# Patient Record
Sex: Female | Born: 1950 | Race: White | Hispanic: No
Health system: Southern US, Community
[De-identification: ages and names within clinical notes are randomized; demographics above are authoritative.]

## PROBLEM LIST (undated history)

## (undated) DIAGNOSIS — R0902 Hypoxemia: Secondary | ICD-10-CM

## (undated) DIAGNOSIS — J159 Unspecified bacterial pneumonia: Secondary | ICD-10-CM

## (undated) DIAGNOSIS — U071 COVID-19: Secondary | ICD-10-CM

## (undated) DIAGNOSIS — J9621 Acute and chronic respiratory failure with hypoxia: Secondary | ICD-10-CM

---

## 2020-07-19 ENCOUNTER — Inpatient Hospital Stay
Admit: 2020-07-19 | Discharge: 2020-08-11 | Disposition: A | Discharge status: Skilled Nursing Facility | Payer: Medicare Other | Source: Other Acute Inpatient Hospital | Attending: Internal Medicine | Admitting: Internal Medicine

## 2020-07-19 DIAGNOSIS — J9621 Acute and chronic respiratory failure with hypoxia: Secondary | ICD-10-CM | POA: Diagnosis present

## 2020-07-19 DIAGNOSIS — Z0189 Encounter for other specified special examinations: Secondary | ICD-10-CM

## 2020-07-19 DIAGNOSIS — R1310 Dysphagia, unspecified: Secondary | ICD-10-CM

## 2020-07-19 DIAGNOSIS — U071 COVID-19: Secondary | ICD-10-CM | POA: Diagnosis present

## 2020-07-19 DIAGNOSIS — R0902 Hypoxemia: Secondary | ICD-10-CM | POA: Diagnosis present

## 2020-07-19 DIAGNOSIS — J159 Unspecified bacterial pneumonia: Secondary | ICD-10-CM | POA: Diagnosis present

## 2020-07-19 DIAGNOSIS — J9690 Respiratory failure, unspecified, unspecified whether with hypoxia or hypercapnia: Secondary | ICD-10-CM

## 2020-07-19 HISTORY — DX: Unspecified bacterial pneumonia: J15.9

## 2020-07-19 HISTORY — DX: COVID-19: U07.1

## 2020-07-19 HISTORY — DX: Acute and chronic respiratory failure with hypoxia: J96.21

## 2020-07-19 HISTORY — DX: Hypoxemia: R09.02

## 2020-07-20 ENCOUNTER — Other Ambulatory Visit (HOSPITAL_COMMUNITY): Payer: Medicare Other

## 2020-07-20 LAB — CBC
HCT: 39.6 % (ref 36.0–46.0)
Hemoglobin: 12.6 g/dL (ref 12.0–15.0)
MCH: 27.5 pg (ref 26.0–34.0)
MCHC: 31.8 g/dL (ref 30.0–36.0)
MCV: 86.5 fL (ref 80.0–100.0)
Platelets: 196 10*3/uL (ref 150–400)
RBC: 4.58 MIL/uL (ref 3.87–5.11)
RDW: 14.4 % (ref 11.5–15.5)
WBC: 6.3 10*3/uL (ref 4.0–10.5)
nRBC: 0 % (ref 0.0–0.2)

## 2020-07-20 LAB — COMPREHENSIVE METABOLIC PANEL
ALT: 36 U/L (ref 0–44)
AST: 28 U/L (ref 15–41)
Albumin: 2.8 g/dL — ABNORMAL LOW (ref 3.5–5.0)
Alkaline Phosphatase: 28 U/L — ABNORMAL LOW (ref 38–126)
Anion gap: 7 (ref 5–15)
BUN: 39 mg/dL — ABNORMAL HIGH (ref 8–23)
CO2: 32 mmol/L (ref 22–32)
Calcium: 8.9 mg/dL (ref 8.9–10.3)
Chloride: 96 mmol/L — ABNORMAL LOW (ref 98–111)
Creatinine, Ser: 0.88 mg/dL (ref 0.44–1.00)
GFR, Estimated: >60 mL/min (ref >60)
Glucose, Bld: 98 mg/dL (ref 70–99)
Potassium: 4.3 mmol/L (ref 3.5–5.1)
Sodium: 135 mmol/L (ref 135–145)
Total Bilirubin: 0.8 mg/dL (ref 0.3–1.2)
Total Protein: 4.7 g/dL — ABNORMAL LOW (ref 6.5–8.1)

## 2020-07-24 LAB — CBC
HCT: 42.6 % (ref 36.0–46.0)
Hemoglobin: 13.4 g/dL (ref 12.0–15.0)
MCH: 27.3 pg (ref 26.0–34.0)
MCHC: 31.5 g/dL (ref 30.0–36.0)
MCV: 86.9 fL (ref 80.0–100.0)
Platelets: 171 10*3/uL (ref 150–400)
RBC: 4.9 MIL/uL (ref 3.87–5.11)
RDW: 15.1 % (ref 11.5–15.5)
WBC: 6.5 10*3/uL (ref 4.0–10.5)
nRBC: 0 % (ref 0.0–0.2)

## 2020-07-24 LAB — BASIC METABOLIC PANEL
Anion gap: 9 (ref 5–15)
BUN: 38 mg/dL — ABNORMAL HIGH (ref 8–23)
CO2: 29 mmol/L (ref 22–32)
Calcium: 9.8 mg/dL (ref 8.9–10.3)
Chloride: 100 mmol/L (ref 98–111)
Creatinine, Ser: 0.71 mg/dL (ref 0.44–1.00)
GFR, Estimated: >60 mL/min (ref >60)
Glucose, Bld: 119 mg/dL — ABNORMAL HIGH (ref 70–99)
Potassium: 4.4 mmol/L (ref 3.5–5.1)
Sodium: 138 mmol/L (ref 135–145)

## 2020-07-25 ENCOUNTER — Other Ambulatory Visit (HOSPITAL_COMMUNITY): Payer: Medicare Other

## 2020-07-27 ENCOUNTER — Other Ambulatory Visit (HOSPITAL_COMMUNITY): Payer: Medicare Other

## 2020-07-27 LAB — BASIC METABOLIC PANEL
Anion gap: 18 — ABNORMAL HIGH (ref 5–15)
BUN: 62 mg/dL — ABNORMAL HIGH (ref 8–23)
CO2: 25 mmol/L (ref 22–32)
Calcium: 10.2 mg/dL (ref 8.9–10.3)
Chloride: 91 mmol/L — ABNORMAL LOW (ref 98–111)
Creatinine, Ser: 1.01 mg/dL — ABNORMAL HIGH (ref 0.44–1.00)
GFR, Estimated: >60 mL/min (ref >60)
Glucose, Bld: 242 mg/dL — ABNORMAL HIGH (ref 70–99)
Potassium: 3.3 mmol/L — ABNORMAL LOW (ref 3.5–5.1)
Sodium: 134 mmol/L — ABNORMAL LOW (ref 135–145)

## 2020-07-27 LAB — CBC
HCT: 43.1 % (ref 36.0–46.0)
Hemoglobin: 14.2 g/dL (ref 12.0–15.0)
MCH: 27.8 pg (ref 26.0–34.0)
MCHC: 32.9 g/dL (ref 30.0–36.0)
MCV: 84.3 fL (ref 80.0–100.0)
Platelets: 142 10*3/uL — ABNORMAL LOW (ref 150–400)
RBC: 5.11 MIL/uL (ref 3.87–5.11)
RDW: 14.9 % (ref 11.5–15.5)
WBC: 12 10*3/uL — ABNORMAL HIGH (ref 4.0–10.5)
nRBC: 0 % (ref 0.0–0.2)

## 2020-07-27 LAB — BRAIN NATRIURETIC PEPTIDE: B Natriuretic Peptide: 126.3 pg/mL — ABNORMAL HIGH (ref 0.0–100.0)

## 2020-07-27 MED ORDER — IOHEXOL 300 MG/ML  SOLN
75.0000 mL | Freq: Once | INTRAMUSCULAR | Status: AC | PRN
  Administered 2020-07-27: 75 mL via INTRAVENOUS

## 2020-07-28 NOTE — Consult Note (Signed)
Infectious Disease Consultation   Amanda Pittman  OIZ:[id number]  DOB: 1951/08/18  DOA: 07/19/2020  Requesting physician: Daine Gravel  Reason for consultation: Antibiotic recommendations   History of Present Illness: Amanda Pittman is an 69 y.o. female who was admitted to [facility] on 07/19/2020.  She has a history of rectal cancer, hypertension, hyperlipidemia.  She was diagnosed with Covid in September 2021.  She was admitted on 07/04/2020 at the acute facility after progressively worsening shortness of breath with increased oxygen requirement.  She had CTA that did not show any evidence of PE.  She had Covid related infiltrates.  She was treated with remdesivir, Decadron, zinc, Tocilizumab.  She had CT of the chest done yesterday which per report showing widespread bilateral hazy, patchy pulmonary infiltrates. Patient started on empiric antibiotics for likely aspiration pneumonia.  She is currently on 6 L Oxymizer.  She says she is having some cough but unable to produce any sputum.  She is having shortness of breath, denies having any chest pain, nausea, vomiting, abdominal pain, diarrhea or dysuria at this time.   Review of Systems:  Review of system negative except as mentioned above in the HPI.   Past Medical History: Hypertension, hyperlipidemia, rectal cancer  Past Surgical History: History of colostomy  Allergies: Codeine  Social History: No history of alcohol, tobacco or recreational drug abuse  Family History: Noncontributory to the present illness  Physical Exam: Vitals: Temperature 97.1, pulse 97, respiratory rate 14, blood pressure 134/66 Constitutional: Alert and awake, oriented x3, not in any acute distress. Eyes: PERLA, EOMI, irises appear normal, anicteric sclera,  ENMT: external ears and nose appear normal, normal hearing, lips appears normal, moist oral mucosa  Neck: neck appears normal, no masses, normal ROM CVS: S1-S2   Respiratory: Rhonchi, no wheezing Abdomen: soft nontender, nondistended, ostomy, normal bowel sounds Musculoskeletal: No edema Neuro: Cranial nerves II-XII intact Psych: judgement and insight appear normal, stable mood and affect Skin: no rashes  Data reviewed:  I have personally reviewed following labs and imaging studies Labs:  CBC: Recent Labs  Lab 07/24/20 0713 07/27/20 1706  WBC 6.5 12.0*  HGB 13.4 14.2  HCT 42.6 43.1  MCV 86.9 84.3  PLT 171 142*    Basic Metabolic Panel: Recent Labs  Lab 07/24/20 0713 07/27/20 1706  NA 138 134*  K 4.4 3.3*  CL 100 91*  CO2 29 25  GLUCOSE 119* 242*  BUN 38* 62*  CREATININE 0.71 1.01*  CALCIUM 9.8 10.2   GFR CrCl cannot be calculated (Unknown ideal weight.). Liver Function Tests: No results for input(s): AST, ALT, ALKPHOS, BILITOT, PROT, ALBUMIN in the last 168 hours. No results for input(s): LIPASE, AMYLASE in the last 168 hours. No results for input(s): AMMONIA in the last 168 hours. Coagulation profile No results for input(s): INR, PROTIME in the last 168 hours.  Cardiac Enzymes: No results for input(s): CKTOTAL, CKMB, CKMBINDEX, TROPONINI in the last 168 hours. BNP: Invalid input(s): POCBNP CBG: No results for input(s): GLUCAP in the last 168 hours. D-Dimer No results for input(s): DDIMER in the last 72 hours. Hgb A1c No results for input(s): HGBA1C in the last 72 hours. Lipid Profile No results for input(s): CHOL, HDL, LDLCALC, TRIG, CHOLHDL, LDLDIRECT in the last 72 hours. Thyroid function studies No results for input(s): TSH, T4TOTAL, T3FREE, THYROIDAB in the last 72 hours.  Invalid input(s): FREET3 Anemia work up No results for input(s): VITAMINB12, FOLATE,  FERRITIN, TIBC, IRON, RETICCTPCT in the last 72 hours. Urinalysis No results found for: COLORURINE, APPEARANCEUR, LABSPEC, PHURINE, GLUCOSEU, HGBUR, BILIRUBINUR, KETONESUR, PROTEINUR, UROBILINOGEN, NITRITE, LEUKOCYTESUR   Microbiology No results  found for this or any previous visit (from the past 240 hour(s)).   Inpatient Medications:   Please see MAR   Radiological Exams on Admission: CT CHEST W CONTRAST  Result Date: 07/27/2020 CLINICAL DATA:  Respiratory failure.  Pneumonia. EXAM: CT CHEST WITH CONTRAST TECHNIQUE: Multidetector CT imaging of the chest was performed during intravenous contrast administration. CONTRAST:  59mL OMNIPAQUE IOHEXOL 300 MG/ML  SOLN COMPARISON:  Radiography 07/25/2020 and 07/20/2020. FINDINGS: Cardiovascular: Heart size is normal.  No pericardial fluid. Mediastinum/Nodes: Pneumomediastinum. This is small to moderate in degree. No mass or lymphadenopathy. Lungs/Pleura: Widespread bilateral hazy and patchy pulmonary infiltrates with a pattern often seen in coronavirus pneumonia. Other infectious etiologies are certainly possible. No lobar consolidation, collapse or effusion. Tiny amount of pleural air on both sides. Upper Abdomen: Previous cholecystectomy. No acute upper abdominal finding. Musculoskeletal: Ordinary thoracic degenerative changes. IMPRESSION: 1. Widespread bilateral hazy and patchy pulmonary infiltrates with a pattern often seen in coronavirus pneumonia. Other infectious etiologies are possible. No lobar consolidation, collapse or effusion. Moderate pneumomediastinum. Tiny amount of pleural air on both sides, the most minimal detectable. Electronically Signed   By: Paulina Fusi M.D.   On: 07/27/2020 14:19    Impression/Recommendations Acute hypoxemic respiratory failure COVID-19 infection Pneumonia Leukocytosis Hypertension/hyperlipidemia Protein calorie malnutrition History of rectal cancer status post surgery with colostomy Debility  Acute hypoxemic respiratory failure: Patient had COVID-19 infection.  She was already treated for this at the outside facility.  She is on steroids, nebulizers.  Continue supportive management per the primary team.  Pneumonia: Chest imaging showing findings  concerning for pneumonia.  Probable secondary bacterial pneumonia.  She is having cough with shortness of breath but unable to produce sputum therefore may not be able to send for sputum cultures.  She has been started on cefepime, Flagyl empirically.  Will discontinue cefepime and switch to ciprofloxacin.  Continue Flagyl.  We will plan to treat for duration of 5- 7 days pending improvement.  If not improving consider repeating chest imaging preferably chest CT and sending sputum cultures if feasible.  Leukocytosis: Could be likely secondary to the steroids versus secondary bacterial pneumonia.  Continue to monitor.  Antibiotics as mentioned above.  Hypertension/hyperlipidemia: Continue medications and management per the primary team.  Protein calorie malnutrition: Management per the primary team.  Debility: She has some generalized weakness from the debility. Continue therapy and management per primary team.  Thank you for this consultation.  Plan of care discussed with the patient, primary team and pharmacy.  Vonzella Nipple M.D. 07/28/2020, 3:24 PM

## 2020-07-29 ENCOUNTER — Encounter: Payer: Self-pay | Admitting: Internal Medicine

## 2020-07-29 DIAGNOSIS — U071 COVID-19: Secondary | ICD-10-CM | POA: Diagnosis present

## 2020-07-29 DIAGNOSIS — J9621 Acute and chronic respiratory failure with hypoxia: Secondary | ICD-10-CM | POA: Diagnosis not present

## 2020-07-29 DIAGNOSIS — R0902 Hypoxemia: Secondary | ICD-10-CM | POA: Diagnosis present

## 2020-07-29 DIAGNOSIS — J159 Unspecified bacterial pneumonia: Secondary | ICD-10-CM | POA: Diagnosis not present

## 2020-07-29 LAB — CBC
HCT: 45.7 % (ref 36.0–46.0)
Hemoglobin: 14.8 g/dL (ref 12.0–15.0)
MCH: 27.5 pg (ref 26.0–34.0)
MCHC: 32.4 g/dL (ref 30.0–36.0)
MCV: 84.9 fL (ref 80.0–100.0)
Platelets: 148 10*3/uL — ABNORMAL LOW (ref 150–400)
RBC: 5.38 MIL/uL — ABNORMAL HIGH (ref 3.87–5.11)
RDW: 15.9 % — ABNORMAL HIGH (ref 11.5–15.5)
WBC: 9.1 10*3/uL (ref 4.0–10.5)
nRBC: 0 % (ref 0.0–0.2)

## 2020-07-29 LAB — BASIC METABOLIC PANEL
Anion gap: 16 — ABNORMAL HIGH (ref 5–15)
BUN: 64 mg/dL — ABNORMAL HIGH (ref 8–23)
CO2: 28 mmol/L (ref 22–32)
Calcium: 10.2 mg/dL (ref 8.9–10.3)
Chloride: 94 mmol/L — ABNORMAL LOW (ref 98–111)
Creatinine, Ser: 0.83 mg/dL (ref 0.44–1.00)
GFR, Estimated: >60 mL/min (ref >60)
Glucose, Bld: 198 mg/dL — ABNORMAL HIGH (ref 70–99)
Potassium: 3.5 mmol/L (ref 3.5–5.1)
Sodium: 138 mmol/L (ref 135–145)

## 2020-07-29 LAB — MAGNESIUM: Magnesium: 2.7 mg/dL — ABNORMAL HIGH (ref 1.7–2.4)

## 2020-07-29 NOTE — Consult Note (Signed)
Pulmonary Critical Care Medicine [facility] [place]  PULMONARY SERVICE  Date of Service: 07/29/2020  PULMONARY CRITICAL CARE CONSULT   Amanda Pittman  XVQ:[id number]  DOB: [birth year]   DOA: 07/19/2020  Referring Physician: Carron Curie, MD  HPI: Amanda Pittman is a 69 y.o. female seen for follow up of Acute on Chronic Respiratory Failure.  Patient has multiple medical problems including hypertension hyperlipidemia rectal cancer who was diagnosed with COVID-19 in September.  She was admitted in early October because of worsening shortness of breath.  At that time patient had worsening infiltrates consistent with COVID-19 infection.  Patient received remdesivir Decadron as well as Tocilizumab.  She has been having issues with ongoing high oxygen requirements.  Patient is on high flow oxygen at this time.  CT scan has shown patchy areas of infiltrate consistent with pneumonia.  She has been on Oxymizer and the oxygen requirements have gone up to high flow nasal cannula.  Review of Systems:  ROS performed and is unremarkable other than noted above.  Medical Hx: Past Medical History:  Diagnosis Date  . Arthritis  . At risk for falls  . Cancer (CMS-HCC)  uterine/rectal  . Cataract  not bad enough for surgery  . Colon cancer (CMS-HCC)  . Colon polyp  . H/O seasonal allergies  . Hyperlipidemia  . Hypertension  . Memory changes 05/04/2020  . Obesity  . RLS (restless legs syndrome)   Surgical Hx: Past Surgical History:  Procedure Laterality Date  . APPENDECTOMY 1978  . CHOLECYSTECTOMY 1984  . COLECTOMY 2002  . COLOSTOMY 2002  . HYSTERECTOMY  . JOINT REPLACEMENT Bilateral  2018  . LAPAROSCOPIC ASSISTED RADICAL VAGINAL HYSTERECTOMY W/ NODE BIOPSY  . PR COLONOSCOPY STOMA DX INDUDING COLLJ SPEC SPX N/A 07/05/2019  Procedure: COLONOSCOPY-STOMA; DX W/WO SPECMN (SEPART PROC); Surgeon: Donnie Coffin, MD; Location: [facility]; Service: General  Surgery  . PR TOTAL KNEE ARTHROPLASTY Right 11/05/2016  Procedure: ARTHROPLASTY, KNEE, CONDYLE & PLATEAU; MEDIAL & LAT COMPARTMENT W/WO PATELLA RESURFACE (TOTAL KNEE ARTHROP); Surgeon: Carmela Hurt, MD; Location: [facility]; Service: Orthopedics  . PR TOTAL KNEE ARTHROPLASTY Left 08/12/2017  Procedure: ARTHROPLASTY, KNEE, CONDYLE & PLATEAU; MEDIAL & LAT COMPARTMENT W/WO PATELLA RESURFACE (TOTAL KNEE ARTHROP); Surgeon: Carmela Hurt, MD; Location: [facility]; Service: Orthopedics   Family history: Mother had dementia, father died of cancerProblem Relation Age of Onset  . Alzheimer's disease Mother  . Cancer Father  lung smoker  . Alzheimer's disease Sister  . Heart disease Brother  . COPD Brother       Medications: Reviewed on Rounds  Physical Exam:  Vitals: Temperature is 97.5 pulse 108 respiratory 23 blood pressure is 149/79 saturations are 90%  Ventilator Settings on high flow oxygen weaning to Oxymizer  . General: Comfortable at this time . Eyes: Grossly normal lids, irises & conjunctiva . ENT: grossly tongue is normal . Neck: no obvious mass . Cardiovascular: S1-S2 normal no gallop or rub . Respiratory: No rhonchi no rales are noted at this time . Abdomen: Soft and nontender . Skin: no rash seen on limited exam . Musculoskeletal: not rigid . Psychiatric:unable to assess . Neurologic: no seizure no involuntary movements         Labs on Admission:  Basic Metabolic Panel: Recent Labs  Lab 07/24/20 0713 07/27/20 1706 07/29/20 0754  NA 138 134* 138  K 4.4 3.3* 3.5  CL 100 91* 94*  CO2 29 25 28   GLUCOSE 119* 242* 198*  BUN 38* 62* 64*  CREATININE 0.71 1.01* 0.83  CALCIUM 9.8 10.2 10.2  MG  --   --  2.7*    No results for input(s): PHART, PCO2ART, PO2ART, HCO3, O2SAT in the last 168 hours.  Liver Function Tests: No results for input(s): AST, ALT, ALKPHOS, BILITOT, PROT, ALBUMIN in the last 168 hours. No results for input(s): LIPASE, AMYLASE in  the last 168 hours. No results for input(s): AMMONIA in the last 168 hours.  CBC: Recent Labs  Lab 07/24/20 0713 07/27/20 1706 07/29/20 0754  WBC 6.5 12.0* 9.1  HGB 13.4 14.2 14.8  HCT 42.6 43.1 45.7  MCV 86.9 84.3 84.9  PLT 171 142* 148*    Cardiac Enzymes: No results for input(s): CKTOTAL, CKMB, CKMBINDEX, TROPONINI in the last 168 hours.  BNP (last 3 results) Recent Labs    07/27/20 1706  BNP 126.3*    ProBNP (last 3 results) No results for input(s): PROBNP in the last 8760 hours.   Radiological Exams on Admission: CT CHEST W CONTRAST  Result Date: 07/27/2020 CLINICAL DATA:  Respiratory failure.  Pneumonia. EXAM: CT CHEST WITH CONTRAST TECHNIQUE: Multidetector CT imaging of the chest was performed during intravenous contrast administration. CONTRAST:  62mL OMNIPAQUE IOHEXOL 300 MG/ML  SOLN COMPARISON:  Radiography 07/25/2020 and 07/20/2020. FINDINGS: Cardiovascular: Heart size is normal.  No pericardial fluid. Mediastinum/Nodes: Pneumomediastinum. This is small to moderate in degree. No mass or lymphadenopathy. Lungs/Pleura: Widespread bilateral hazy and patchy pulmonary infiltrates with a pattern often seen in coronavirus pneumonia. Other infectious etiologies are certainly possible. No lobar consolidation, collapse or effusion. Tiny amount of pleural air on both sides. Upper Abdomen: Previous cholecystectomy. No acute upper abdominal finding. Musculoskeletal: Ordinary thoracic degenerative changes. IMPRESSION: 1. Widespread bilateral hazy and patchy pulmonary infiltrates with a pattern often seen in coronavirus pneumonia. Other infectious etiologies are possible. No lobar consolidation, collapse or effusion. Moderate pneumomediastinum. Tiny amount of pleural air on both sides, the most minimal detectable. Electronically Signed   By: Paulina Fusi M.D.   On: 07/27/2020 14:19   DG CHEST PORT 1 VIEW  Result Date: 07/25/2020 CLINICAL DATA:  Shortness of breath, respiratory  failure EXAM: PORTABLE CHEST 1 VIEW COMPARISON:  Portable exam 1236 hours compared to 07/20/2020 FINDINGS: Normal heart size, mediastinal contours, and pulmonary vascularity. Central peribronchial thickening with patchy BILATERAL pulmonary infiltrates in the mid to lower lungs most consistent with multifocal pneumonia. Infiltrates appear grossly unchanged since previous exam. No pleural effusion or pneumothorax. Osseous structures unremarkable. IMPRESSION: Bronchitic changes with patchy BILATERAL pulmonary infiltrates in the mid to lower lungs consistent with multifocal pneumonia, grossly unchanged. Electronically Signed   By: Ulyses Southward M.D.   On: 07/25/2020 13:03    Assessment/Plan Active Problems:   Acute on chronic respiratory failure with hypoxia (HCC)   COVID-19 virus infection   Bacterial lobar pneumonia   Severe hypoxemia   1. Acute on chronic respiratory failure with hypoxia we will titrate oxygen down as tolerated chest x-ray CT scan results reviewed with some pulmonary infiltrates scattered throughout with groundglass opacities.  Consistent with coronavirus pneumonitis. 2. COVID-19 virus infection in recovery infectious disease is seeing the patient continue with their recommendations.  There is concern for secondary bacterial pneumonia on top of the viral recovery. 3. Bacterial pneumonia patient's antibiotics will be adjusted.  Patient has been on cefepime and Flagyl they recommended continuation of Flagyl. 4. Severe hypoxemia has been on high flow oxygen we will continue to adjust oxygen down.  I have personally  seen and evaluated the patient, evaluated laboratory and imaging results, formulated the assessment and plan and placed orders. The Patient requires high complexity decision making with multiple systems involvement.  Case was discussed on Rounds with the Respiratory Therapy Director and the Respiratory staff Time Spent  Yevonne Pax, MD [facility] Pulmonary Critical  Care Medicine Sleep Medicine

## 2020-07-30 DIAGNOSIS — J159 Unspecified bacterial pneumonia: Secondary | ICD-10-CM | POA: Diagnosis not present

## 2020-07-30 DIAGNOSIS — U071 COVID-19: Secondary | ICD-10-CM | POA: Diagnosis not present

## 2020-07-30 DIAGNOSIS — J9621 Acute and chronic respiratory failure with hypoxia: Secondary | ICD-10-CM | POA: Diagnosis not present

## 2020-07-30 DIAGNOSIS — R0902 Hypoxemia: Secondary | ICD-10-CM | POA: Diagnosis not present

## 2020-07-30 NOTE — Progress Notes (Signed)
Pulmonary Critical Care Medicine [facility] [place]   PULMONARY CRITICAL CARE SERVICE  PROGRESS NOTE  Date of Service: 07/30/2020  Amanda Pittman  JKD:[id number]  DOB: [birth year]   DOA: 07/19/2020  Referring Physician: Carron Curie, MD  HPI: Amanda Pittman is a 69 y.o. female seen for follow up of Acute on Chronic Respiratory Failure. She remains on high flow nasal cannula has been on 85% FiO2 currently requiring 40 L of oxygen. She seems to do worse laying flat. Most of her disease is in the lower lung zones based on the CT scan. She also did volunteer that she has a history of snoring and she thinks she may very well have sleep apnea but has never been formally tested for it or treated for it.  Medications: Reviewed on Rounds  Physical Exam:  Vitals: Temperature is 97.0 pulse 97 respiratory 15 blood pressure is 108/59 saturations 90%  Ventilator Settings off the ventilator on heated high flow FiO2 85% with 40 L flow rate  . General: Comfortable at this time . Eyes: Grossly normal lids, irises & conjunctiva . ENT: grossly tongue is normal . Neck: no obvious mass . Cardiovascular: S1 S2 normal no gallop . Respiratory: Scattered rhonchi coarse breath sounds . Abdomen: soft . Skin: no rash seen on limited exam . Musculoskeletal: not rigid . Psychiatric:unable to assess . Neurologic: no seizure no involuntary movements         Lab Data:   Basic Metabolic Panel: Recent Labs  Lab 07/24/20 0713 07/27/20 1706 07/29/20 0754  NA 138 134* 138  K 4.4 3.3* 3.5  CL 100 91* 94*  CO2 29 25 28   GLUCOSE 119* 242* 198*  BUN 38* 62* 64*  CREATININE 0.71 1.01* 0.83  CALCIUM 9.8 10.2 10.2  MG  --   --  2.7*    ABG: No results for input(s): PHART, PCO2ART, PO2ART, HCO3, O2SAT in the last 168 hours.  Liver Function Tests: No results for input(s): AST, ALT, ALKPHOS, BILITOT, PROT, ALBUMIN in the last 168 hours. No results for input(s): LIPASE, AMYLASE in the  last 168 hours. No results for input(s): AMMONIA in the last 168 hours.  CBC: Recent Labs  Lab 07/24/20 0713 07/27/20 1706 07/29/20 0754  WBC 6.5 12.0* 9.1  HGB 13.4 14.2 14.8  HCT 42.6 43.1 45.7  MCV 86.9 84.3 84.9  PLT 171 142* 148*    Cardiac Enzymes: No results for input(s): CKTOTAL, CKMB, CKMBINDEX, TROPONINI in the last 168 hours.  BNP (last 3 results) Recent Labs    07/27/20 1706  BNP 126.3*    ProBNP (last 3 results) No results for input(s): PROBNP in the last 8760 hours.  Radiological Exams: No results found.  Assessment/Plan Active Problems:   Acute on chronic respiratory failure with hypoxia (HCC)   COVID-19 virus infection   Bacterial lobar pneumonia   Severe hypoxemia   1. Acute on chronic respiratory failure with hypoxia we will continue with high flow oxygen titrate as tolerated. I have discussed with her the possibility of using positive airway pressure in the form of BiPAP and she is receptive to that. She will I think especially need to use it at nighttime while asleep. 2. COVID-19 virus infection in recovery 3. Bacterial pneumonia has been treated 4. Severe hypoxia remains on high flow. We will try noninvasive BiPAP to see if this helps with her oxygenation.   I have personally seen and evaluated the patient, evaluated laboratory and imaging results, formulated the assessment  and plan and placed orders. The Patient requires high complexity decision making with multiple systems involvement.  Rounds were done with the Respiratory Therapy Director and Staff therapists and discussed with nursing staff also.  Allyne Gee, MD [facility] Pulmonary Critical Care Medicine Sleep Medicine

## 2020-07-31 ENCOUNTER — Other Ambulatory Visit (HOSPITAL_COMMUNITY): Payer: Medicare Other

## 2020-07-31 DIAGNOSIS — J159 Unspecified bacterial pneumonia: Secondary | ICD-10-CM | POA: Diagnosis not present

## 2020-07-31 DIAGNOSIS — U071 COVID-19: Secondary | ICD-10-CM | POA: Diagnosis not present

## 2020-07-31 DIAGNOSIS — J9621 Acute and chronic respiratory failure with hypoxia: Secondary | ICD-10-CM | POA: Diagnosis not present

## 2020-07-31 DIAGNOSIS — R0902 Hypoxemia: Secondary | ICD-10-CM | POA: Diagnosis not present

## 2020-07-31 NOTE — Progress Notes (Signed)
Pulmonary Critical Care Medicine [facility] [place]   PULMONARY CRITICAL CARE SERVICE  PROGRESS NOTE  Date of Service: 07/31/2020  Amanda Pittman  JJK:[id number]  DOB: 29-Jul-1951   DOA: 07/19/2020  Referring Physician: Carron Curie, MD  HPI: Amanda Pittman is a 69 y.o. female seen for follow up of Acute on Chronic Respiratory Failure.  She used the biPAP briefly overnight.  She states that she did seem to sleep better with it.  Medications: Reviewed on Rounds  Physical Exam:  Vitals: Temperature is 96.6 pulse 85 respiratory rate 16 blood pressure is 150/46 saturations 92%  Ventilator Settings on high flow oxygen 4040 L flow rate 80% FiO2 with trying to use BiPAP at nighttime  . General: Comfortable at this time . Eyes: Grossly normal lids, irises & conjunctiva . ENT: grossly tongue is normal . Neck: no obvious mass . Cardiovascular: S1 S2 normal no gallop . Respiratory: Scattered rhonchi expansion is equal . Abdomen: soft . Skin: no rash seen on limited exam . Musculoskeletal: not rigid . Psychiatric:unable to assess . Neurologic: no seizure no involuntary movements         Lab Data:   Basic Metabolic Panel: Recent Labs  Lab 07/27/20 1706 07/29/20 0754  NA 134* 138  K 3.3* 3.5  CL 91* 94*  CO2 25 28  GLUCOSE 242* 198*  BUN 62* 64*  CREATININE 1.01* 0.83  CALCIUM 10.2 10.2  MG  --  2.7*    ABG: No results for input(s): PHART, PCO2ART, PO2ART, HCO3, O2SAT in the last 168 hours.  Liver Function Tests: No results for input(s): AST, ALT, ALKPHOS, BILITOT, PROT, ALBUMIN in the last 168 hours. No results for input(s): LIPASE, AMYLASE in the last 168 hours. No results for input(s): AMMONIA in the last 168 hours.  CBC: Recent Labs  Lab 07/27/20 1706 07/29/20 0754  WBC 12.0* 9.1  HGB 14.2 14.8  HCT 43.1 45.7  MCV 84.3 84.9  PLT 142* 148*    Cardiac Enzymes: No results for input(s): CKTOTAL, CKMB, CKMBINDEX, TROPONINI in the last 168  hours.  BNP (last 3 results) Recent Labs    07/27/20 1706  BNP 126.3*    ProBNP (last 3 results) No results for input(s): PROBNP in the last 8760 hours.  Radiological Exams: No results found.  Assessment/Plan Active Problems:   Acute on chronic respiratory failure with hypoxia (HCC)   COVID-19 virus infection   Bacterial lobar pneumonia   Severe hypoxemia   1. Acute on chronic respiratory failure hypoxia we will continue with heated high flow during the daytime titrate oxygen down as tolerated. 2. COVID-19 virus infection in recovery we will continue with supportive care. 3. Bacterial lobar pneumonia treated we will continue to follow 4. Severe hypoxia doing a little bit better today will need to continue with BiPAP increased pressures   I have personally seen and evaluated the patient, evaluated laboratory and imaging results, formulated the assessment and plan and placed orders. The Patient requires high complexity decision making with multiple systems involvement.  Rounds were done with the Respiratory Therapy Director and Staff therapists and discussed with nursing staff also.  Yevonne Pax, MD [facility] Pulmonary Critical Care Medicine Sleep Medicine

## 2020-08-01 ENCOUNTER — Other Ambulatory Visit (HOSPITAL_COMMUNITY): Payer: Medicare Other

## 2020-08-01 ENCOUNTER — Other Ambulatory Visit (HOSPITAL_BASED_OUTPATIENT_CLINIC_OR_DEPARTMENT_OTHER): Payer: Medicare Other

## 2020-08-01 DIAGNOSIS — J9621 Acute and chronic respiratory failure with hypoxia: Secondary | ICD-10-CM

## 2020-08-01 DIAGNOSIS — U071 COVID-19: Secondary | ICD-10-CM | POA: Diagnosis not present

## 2020-08-01 DIAGNOSIS — R0902 Hypoxemia: Secondary | ICD-10-CM | POA: Diagnosis not present

## 2020-08-01 DIAGNOSIS — J159 Unspecified bacterial pneumonia: Secondary | ICD-10-CM | POA: Diagnosis not present

## 2020-08-01 LAB — CBC
HCT: 43.8 % (ref 36.0–46.0)
Hemoglobin: 13.9 g/dL (ref 12.0–15.0)
MCH: 27.5 pg (ref 26.0–34.0)
MCHC: 31.7 g/dL (ref 30.0–36.0)
MCV: 86.6 fL (ref 80.0–100.0)
Platelets: 183 10*3/uL (ref 150–400)
RBC: 5.06 MIL/uL (ref 3.87–5.11)
RDW: 16 % — ABNORMAL HIGH (ref 11.5–15.5)
WBC: 16.9 10*3/uL — ABNORMAL HIGH (ref 4.0–10.5)
nRBC: 0.1 % (ref 0.0–0.2)

## 2020-08-01 LAB — BASIC METABOLIC PANEL
Anion gap: 17 — ABNORMAL HIGH (ref 5–15)
BUN: 72 mg/dL — ABNORMAL HIGH (ref 8–23)
CO2: 27 mmol/L (ref 22–32)
Calcium: 10 mg/dL (ref 8.9–10.3)
Chloride: 96 mmol/L — ABNORMAL LOW (ref 98–111)
Creatinine, Ser: 0.87 mg/dL (ref 0.44–1.00)
GFR, Estimated: >60 mL/min (ref >60)
Glucose, Bld: 243 mg/dL — ABNORMAL HIGH (ref 70–99)
Potassium: 3.3 mmol/L — ABNORMAL LOW (ref 3.5–5.1)
Sodium: 140 mmol/L (ref 135–145)

## 2020-08-01 NOTE — Progress Notes (Signed)
  Echocardiogram 2D Echocardiogram has been performed.  Celene Skeen 08/01/2020, 4:42 PM

## 2020-08-01 NOTE — Progress Notes (Signed)
Pulmonary Critical Care Medicine [facility] [place]   PULMONARY CRITICAL CARE SERVICE  PROGRESS NOTE  Date of Service: 08/01/2020  Amanda Pittman  YIF:[id number]  DOB: 1950/12/03   DOA: 07/19/2020  Referring Physician: Carron Curie, MD  HPI: Amanda Pittman is a 69 y.o. female seen for follow up of Acute on Chronic Respiratory Failure.  She remains on high flow oxygen 40 L she was using the BiPAP a little bit more appears to do fine with it at nighttime.  Medications: Reviewed on Rounds  Physical Exam:  Vitals: Temperature 97.2 pulse 97 respiratory 18 blood pressure is 144/73 saturations 92%  Ventilator Settings on heated high flow 40 L FiO2 80%   General: Comfortable at this time  Eyes: Grossly normal lids, irises & conjunctiva  ENT: grossly tongue is normal  Neck: no obvious mass  Cardiovascular: S1 S2 normal no gallop  Respiratory: No rhonchi coarse breath sounds  Abdomen: soft  Skin: no rash seen on limited exam  Musculoskeletal: not rigid  Psychiatric:unable to assess  Neurologic: no seizure no involuntary movements         Lab Data:   Basic Metabolic Panel: Recent Labs  Lab 07/27/20 1706 07/29/20 0754 08/01/20 0404  NA 134* 138 140  K 3.3* 3.5 3.3*  CL 91* 94* 96*  CO2 25 28 27   GLUCOSE 242* 198* 243*  BUN 62* 64* 72*  CREATININE 1.01* 0.83 0.87  CALCIUM 10.2 10.2 10.0  MG  --  2.7*  --     ABG: No results for input(s): PHART, PCO2ART, PO2ART, HCO3, O2SAT in the last 168 hours.  Liver Function Tests: No results for input(s): AST, ALT, ALKPHOS, BILITOT, PROT, ALBUMIN in the last 168 hours. No results for input(s): LIPASE, AMYLASE in the last 168 hours. No results for input(s): AMMONIA in the last 168 hours.  CBC: Recent Labs  Lab 07/27/20 1706 07/29/20 0754 08/01/20 0404  WBC 12.0* 9.1 16.9*  HGB 14.2 14.8 13.9  HCT 43.1 45.7 43.8  MCV 84.3 84.9 86.6  PLT 142* 148* 183    Cardiac Enzymes: No results for  input(s): CKTOTAL, CKMB, CKMBINDEX, TROPONINI in the last 168 hours.  BNP (last 3 results) Recent Labs    07/27/20 1706  BNP 126.3*    ProBNP (last 3 results) No results for input(s): PROBNP in the last 8760 hours.  Radiological Exams: DG CHEST PORT 1 VIEW  Result Date: 08/01/2020 CLINICAL DATA:  Respiratory failure EXAM: PORTABLE CHEST 1 VIEW COMPARISON:  07/27/2020 FINDINGS: Cardiac shadow is stable. The previously seen pneumomediastinum is again identified but decreased. Patchy airspace opacities are again identified relatively stable from the prior exam. No sizable effusion is seen. No definitive pneumothorax is noted. The bony structures appear stable. IMPRESSION: Patchy airspace opacities similar to that seen on the prior exam consistent with the given clinical history. Electronically Signed   By: 07/29/2020 M.D.   On: 08/01/2020 15:33   ECHOCARDIOGRAM COMPLETE  Result Date: 08/01/2020    ECHOCARDIOGRAM REPORT   Patient Name:   Amanda Pittman Mitchell Date of Exam: 08/01/2020 Medical Rec #:  13/11/2019          Height:       65.0 in Accession #:    [id number]         Weight:       152.0 lb Date of Birth:  1951/05/14           BSA:  1.760 m Patient Age:    69 years           BP:           134/66 mmHg Patient Gender: F                  HR:           97 bpm. Exam Location:  Inpatient Procedure: 2D Echo Indications:    786.09 dyspnea  History:        Patient has no prior history of Echocardiogram examinations.                 Risk Factors:Hypertension and Dyslipidemia. Acute on chronic                 respiratory failure with hypoxia                 COVID-19 virus infection                 Bacterial lobar pneumonia                 worsening infiltrates due to covid infection.  Sonographer:    Celene Skeen RDCS (AE) Referring Phys: [id number] Tabbetha Kutscher A Yianna Tersigni  Sonographer Comments: No apical window, no parasternal window and Technically difficult study due to poor echo windows. IMPRESSIONS  1.  Extremely limited echo with poor windows.  2. Left ventricular ejection fraction could not be assessed. Left ventricular endocardial border not optimally defined to evaluate regional wall motion. Left ventricular diastolic parameters are indeterminate.  3. Right ventricular systolic function is hyperdynamic. The right ventricular size is normal.  4. The mitral valve was not assessed. No evidence of mitral valve regurgitation.  5. The aortic valve was not assessed. Aortic valve regurgitation is not visualized.  6. The inferior vena cava is normal in size with greater than 50% respiratory variability, suggesting right atrial pressure of 3 mmHg. Conclusion(s)/Recommendation(s): Very limited images and windows. Significant artifact. Consider alternate modality to evalute LV function if necessary. FINDINGS  Left Ventricle: Left ventricular ejection fraction could not adequately be assesed. Left ventricular endocardial border not optimally defined to evaluate regional wall motion. Left ventricular diastolic parameters are indeterminate. Right Ventricle: The right ventricular size is normal. No increase in right ventricular wall thickness. Right ventricular systolic function is hyperdynamic. Left Atrium: Left atrial size was not assessed. Right Atrium: Right atrial size was not assessed. Pericardium: The pericardium was not assessed. Mitral Valve: The mitral valve was not assessed. No evidence of mitral valve regurgitation. Tricuspid Valve: The tricuspid valve is not assessed. Tricuspid valve regurgitation is not demonstrated. Aortic Valve: The aortic valve was not assessed. Aortic valve regurgitation is not visualized. Pulmonic Valve: The pulmonic valve was not assessed. Pulmonic valve regurgitation is not visualized. Aorta: Aortic root could not be assessed. Venous: The inferior vena cava is normal in size with greater than 50% respiratory variability, suggesting right atrial pressure of 3 mmHg. IAS/Shunts: The  interatrial septum was not assessed. Zoila Shutter MD Electronically signed by Zoila Shutter MD Signature Date/Time: 08/01/2020/5:02:16 PM    Final     Assessment/Plan Active Problems:   Acute on chronic respiratory failure with hypoxia (HCC)   COVID-19 virus infection   Bacterial lobar pneumonia   Severe hypoxemia   1. Acute on chronic respiratory failure with hypoxia we will continue with trying to titrate oxygen down.  I have also gone ahead and ordered echocardiogram and follow-up x-ray.  The echocardiogram  revealed a preserved ejection fraction.  May need to get cardiology involved as I feel that there is still significant issues from hypoxia perspective which could be related to cardiology issues. 2. COVID-19 virus infection in recovery severe damage post Covid. 3. Bacterial lobar pneumonia treated with antibiotics we will continue to follow. 4. Severe hypoxia remains on high flow oxygen needs   I have personally seen and evaluated the patient, evaluated laboratory and imaging results, formulated the assessment and plan and placed orders. The Patient requires high complexity decision making with multiple systems involvement.  Rounds were done with the Respiratory Therapy Director and Staff therapists and discussed with nursing staff also.  Yevonne Pax, MD [facility] Pulmonary Critical Care Medicine Sleep Medicine

## 2020-08-03 ENCOUNTER — Other Ambulatory Visit (HOSPITAL_COMMUNITY): Payer: Medicare Other

## 2020-08-03 DIAGNOSIS — J9621 Acute and chronic respiratory failure with hypoxia: Secondary | ICD-10-CM | POA: Diagnosis not present

## 2020-08-03 DIAGNOSIS — U071 COVID-19: Secondary | ICD-10-CM | POA: Diagnosis not present

## 2020-08-03 DIAGNOSIS — R0902 Hypoxemia: Secondary | ICD-10-CM | POA: Diagnosis not present

## 2020-08-03 DIAGNOSIS — J159 Unspecified bacterial pneumonia: Secondary | ICD-10-CM | POA: Diagnosis not present

## 2020-08-03 LAB — CBC
HCT: 42.1 % (ref 36.0–46.0)
Hemoglobin: 13.2 g/dL (ref 12.0–15.0)
MCH: 27.6 pg (ref 26.0–34.0)
MCHC: 31.4 g/dL (ref 30.0–36.0)
MCV: 88.1 fL (ref 80.0–100.0)
Platelets: 218 10*3/uL (ref 150–400)
RBC: 4.78 MIL/uL (ref 3.87–5.11)
RDW: 16.6 % — ABNORMAL HIGH (ref 11.5–15.5)
WBC: 8.7 10*3/uL (ref 4.0–10.5)
nRBC: 0 % (ref 0.0–0.2)

## 2020-08-03 LAB — BASIC METABOLIC PANEL
Anion gap: 15 (ref 5–15)
BUN: 55 mg/dL — ABNORMAL HIGH (ref 8–23)
CO2: 26 mmol/L (ref 22–32)
Calcium: 9.6 mg/dL (ref 8.9–10.3)
Chloride: 99 mmol/L (ref 98–111)
Creatinine, Ser: 0.87 mg/dL (ref 0.44–1.00)
GFR, Estimated: >60 mL/min (ref >60)
Glucose, Bld: 356 mg/dL — ABNORMAL HIGH (ref 70–99)
Potassium: 3.2 mmol/L — ABNORMAL LOW (ref 3.5–5.1)
Sodium: 140 mmol/L (ref 135–145)

## 2020-08-03 NOTE — Consult Note (Signed)
Referring Physician: Dr. Lerry Liner Pittman is an 69 y.o. female.                       Chief Complaint: Chest pressure and shortness of breath.  HPI: 69 years old female with acute on chronic respiratory failure, hypertension, hyperlipidemia, rectal cancer had COVID -19 infection. Her shortness of breath continues with worsening pulmonary infiltrates. She is on high flow oxygen. She has some chest discomfort at times. She claims to have nuclear stress test 1-2 years ago and was told to have small blockage that an be treated medically. Her recent echocardiogram was non-diagnostic.   Past Medical History:  Diagnosis Date  . Acute on chronic respiratory failure with hypoxia (HCC)   . Bacterial lobar pneumonia   . COVID-19 virus infection   . Severe hypoxemia     Medical Hx: Past Medical History:  Diagnosis Date  . Arthritis  . At risk for falls  . Cancer (CMS-HCC)  uterine/rectal  . Cataract  not bad enough for surgery  . Colon cancer (CMS-HCC)  . Colon polyp  . H/O seasonal allergies  . Hyperlipidemia  . Hypertension  . Memory changes 05/04/2020  . Obesity  . RLS (restless legs syndrome)   Surgical Hx: Past Surgical History:  Procedure Laterality Date  . APPENDECTOMY 1978  . CHOLECYSTECTOMY 1984  . COLECTOMY 2002  . COLOSTOMY 2002  . HYSTERECTOMY  . JOINT REPLACEMENT Bilateral  2018  . LAPAROSCOPIC ASSISTED RADICAL VAGINAL HYSTERECTOMY W/ NODE BIOPSY  . PR COLONOSCOPY STOMA DX INDUDING COLLJ SPEC SPX N/A 07/05/2019  Procedure: COLONOSCOPY-STOMA; DX W/WO SPECMN (SEPART PROC); Surgeon: Donnie Coffin, MD; Location: [facility]; Service: General Surgery  . PR TOTAL KNEE ARTHROPLASTY Right 11/05/2016  Procedure: ARTHROPLASTY, KNEE, CONDYLE & PLATEAU; MEDIAL & LAT COMPARTMENT W/WO PATELLA RESURFACE (TOTAL KNEE ARTHROP); Surgeon: Carmela Hurt, MD; Location: [facility]; Service: Orthopedics  . PR TOTAL KNEE ARTHROPLASTY Left 08/12/2017  Procedure:  ARTHROPLASTY, KNEE, CONDYLE & PLATEAU; MEDIAL & LAT COMPARTMENT W/WO PATELLA RESURFACE (TOTAL KNEE ARTHROP); Surgeon: Carmela Hurt, MD; Location: [facility]; Service: Orthopedics   Family history: Mother had dementia, father died of cancerProblem Relation Age of Onset  . Alzheimer's disease Mother  . Cancer Father  lung smoker  . Alzheimer's disease Sister  . Heart disease Brother  . COPD Brother   The histories are not reviewed yet. Please review them in the "History" navigator section and refresh this SmartLink.  Social History:  has no history on file for tobacco use, alcohol use, and drug use.  Allergies: Not on File  No medications prior to admission.  As per chart  Results for orders placed or performed during the hospital encounter of 07/19/20 (from the past 48 hour(s))  Basic metabolic panel     Status: Abnormal   Collection Time: 08/03/20  4:16 AM  Result Value Ref Range   Sodium 140 135 - 145 mmol/L   Potassium 3.2 (L) 3.5 - 5.1 mmol/L   Chloride 99 98 - 111 mmol/L   CO2 26 22 - 32 mmol/L   Glucose, Bld 356 (H) 70 - 99 mg/dL    Comment: Glucose reference range applies only to samples taken after fasting for at least 8 hours.   BUN 55 (H) 8 - 23 mg/dL   Creatinine, Ser 2.75 0.44 - 1.00 mg/dL   Calcium 9.6 8.9 - 17.0 mg/dL   GFR, Estimated >01 >74 mL/min  Comment: (NOTE) Calculated using the CKD-EPI Creatinine Equation (2021)    Anion gap 15 5 - 15    Comment: Performed at [facility], [address] [place], [place] [address]  CBC     Status: Abnormal   Collection Time: 08/03/20  4:16 AM  Result Value Ref Range   WBC 8.7 4.0 - 10.5 K/uL   RBC 4.78 3.87 - 5.11 MIL/uL   Hemoglobin 13.2 12.0 - 15.0 g/dL   HCT 28.4 36 - 46 %   MCV 88.1 80.0 - 100.0 fL   MCH 27.6 26.0 - 34.0 pg   MCHC 31.4 30.0 - 36.0 g/dL   RDW 13.2 (H) 44.0 - 10.2 %   Platelets 218 150 - 400 K/uL   nRBC 0.0 0.0 - 0.2 %    Comment: Performed at [facility], [address], [place], [place] [address]   DG CHEST PORT 1 VIEW  Result Date: 08/01/2020 CLINICAL DATA:  Respiratory failure EXAM: PORTABLE CHEST 1 VIEW COMPARISON:  07/27/2020 FINDINGS: Cardiac shadow is stable. The previously seen pneumomediastinum is again identified but decreased. Patchy airspace opacities are again identified relatively stable from the prior exam. No sizable effusion is seen. No definitive pneumothorax is noted. The bony structures appear stable. IMPRESSION: Patchy airspace opacities similar to that seen on the prior exam consistent with the given clinical history. Electronically Signed   By: Alcide Clever M.D.   On: 08/01/2020 15:33   ECHOCARDIOGRAM COMPLETE  Result Date: 08/01/2020    ECHOCARDIOGRAM REPORT   Patient Name:   Amanda Pittman Date of Exam: 08/01/2020 Medical Rec #:  [id number]          Height:       65.0 in Accession #:    [id number]         Weight:       152.0 lb Date of Birth:  Nov 07, 1950           BSA:          1.760 m Patient Age:    69 years           BP:           134/66 mmHg Patient Gender: F                  HR:           97 bpm. Exam Location:  Inpatient Procedure: 2D Echo Indications:    786.09 dyspnea  History:        Patient has no prior history of Echocardiogram examinations.                 Risk Factors:Hypertension and Dyslipidemia. Acute on chronic                 respiratory failure with hypoxia                 COVID-19 virus infection                 Bacterial lobar pneumonia                 worsening infiltrates due to covid infection.  Sonographer:    Celene Skeen RDCS (AE) Referring Phys: [phone] SAADAT A KHAN  Sonographer Comments: No apical window, no parasternal window and Technically difficult study due to poor echo windows. IMPRESSIONS  1. Extremely limited echo with poor windows.  2. Left ventricular ejection fraction could not be assessed. Left  ventricular endocardial border not optimally defined to evaluate regional wall motion. Left ventricular  diastolic parameters are indeterminate.  3. Right ventricular systolic function is hyperdynamic. The right ventricular size is normal.  4. The mitral valve was not assessed. No evidence of mitral valve regurgitation.  5. The aortic valve was not assessed. Aortic valve regurgitation is not visualized.  6. The inferior vena cava is normal in size with greater than 50% respiratory variability, suggesting right atrial pressure of 3 mmHg. Conclusion(s)/Recommendation(s): Very limited images and windows. Significant artifact. Consider alternate modality to evalute LV function if necessary. FINDINGS  Left Ventricle: Left ventricular ejection fraction could not adequately be assesed. Left ventricular endocardial border not optimally defined to evaluate regional wall motion. Left ventricular diastolic parameters are indeterminate. Right Ventricle: The right ventricular size is normal. No increase in right ventricular wall thickness. Right ventricular systolic function is hyperdynamic. Left Atrium: Left atrial size was not assessed. Right Atrium: Right atrial size was not assessed. Pericardium: The pericardium was not assessed. Mitral Valve: The mitral valve was not assessed. No evidence of mitral valve regurgitation. Tricuspid Valve: The tricuspid valve is not assessed. Tricuspid valve regurgitation is not demonstrated. Aortic Valve: The aortic valve was not assessed. Aortic valve regurgitation is not visualized. Pulmonic Valve: The pulmonic valve was not assessed. Pulmonic valve regurgitation is not visualized. Aorta: Aortic root could not be assessed. Venous: The inferior vena cava is normal in size with greater than 50% respiratory variability, suggesting right atrial pressure of 3 mmHg. IAS/Shunts: The interatrial septum was not assessed. Zoila Shutter MD Electronically signed by Zoila Shutter MD Signature Date/Time: 08/01/2020/5:02:16 PM    Final     Review Of Systems As per HPI and PMH..  P: 100, R: 24, BP: 140/80  O2 sat 90 % on high flow oxygen to oxymizer with 70 % O2. There were no vitals taken for this visit. There is no height or weight on file to calculate BMI. General appearance: alert, cooperative, appears stated age and moderate respiratory distress Head: Normocephalic, atraumatic. Eyes: Hazel eyes, pink conjunctiva, corneas clear. PERRL, EOM's intact. Neck: No adenopathy, no carotid bruit, no JVD. Resp: Clearing to auscultation bilaterally. Cardio: Regular rate and rhythm, S1, S2 normal, II/VI systolic murmur, no click, rub or gallop GI: Soft, non-tender; bowel sounds normal; no organomegaly. Extremities: No edema, cyanosis or clubbing. Skin: Warm and dry.  Neurologic: Alert and oriented X 3, normal strength.  Assessment/Plan Acute on chronic respiratory failure with hypoxia S/P COVID-19 pneumonia Possible aspiration pneumonia CAD HTN Hyperlipidemia  Add isosorbide mononitrate small dose to start. Treat patient medically till pulmonary function stabilizes.  Time spent: Review of old records, Lab, x-rays, EKG, other cardiac tests, examination, discussion with patient/Nurse/Referring doctor over 70 minutes.  Ricki Rodriguez, MD  08/03/2020, 10:19 AM

## 2020-08-03 NOTE — Progress Notes (Signed)
Pulmonary Critical Care Medicine [facility] [place]   PULMONARY CRITICAL CARE SERVICE  PROGRESS NOTE  Date of Service: 08/03/2020  Amanda Pittman  SAY:[id number]  DOB: 1951/08/06   DOA: 07/19/2020  Referring Physician: Carron Curie, MD  HPI: Amanda Pittman is a 69 y.o. female seen for follow up of Acute on Chronic Respiratory Failure.  Patient currently is on high flow oxygen saturations are about 90 to 92%.  She is using the BiPAP at nighttime she did better overnight.  Gave her an update.  Reviewed the echocardiogram with the patient as well as with the husband.  Medications: Reviewed on Rounds  Physical Exam:  Vitals: Temperature is 96.9 pulse 84 respiratory rate 16 blood pressure is 101/51 saturations were 92%  Ventilator Settings on high flow oxygen 20 L FiO2 75%   General: Comfortable at this time  Eyes: Grossly normal lids, irises & conjunctiva  ENT: grossly tongue is normal  Neck: no obvious mass  Cardiovascular: S1 S2 normal no gallop  Respiratory: Scattered rhonchi very coarse breath sounds  Abdomen: soft  Skin: no rash seen on limited exam  Musculoskeletal: not rigid  Psychiatric:unable to assess  Neurologic: no seizure no involuntary movements         Lab Data:   Basic Metabolic Panel: Recent Labs  Lab 07/27/20 1706 07/29/20 0754 08/01/20 0404 08/03/20 0416  NA 134* 138 140 140  K 3.3* 3.5 3.3* 3.2*  CL 91* 94* 96* 99  CO2 25 28 27 26   GLUCOSE 242* 198* 243* 356*  BUN 62* 64* 72* 55*  CREATININE 1.01* 0.83 0.87 0.87  CALCIUM 10.2 10.2 10.0 9.6  MG  --  2.7*  --   --     ABG: No results for input(s): PHART, PCO2ART, PO2ART, HCO3, O2SAT in the last 168 hours.  Liver Function Tests: No results for input(s): AST, ALT, ALKPHOS, BILITOT, PROT, ALBUMIN in the last 168 hours. No results for input(s): LIPASE, AMYLASE in the last 168 hours. No results for input(s): AMMONIA in the last 168 hours.  CBC: Recent Labs  Lab  07/27/20 1706 07/29/20 0754 08/01/20 0404 08/03/20 0416  WBC 12.0* 9.1 16.9* 8.7  HGB 14.2 14.8 13.9 13.2  HCT 43.1 45.7 43.8 42.1  MCV 84.3 84.9 86.6 88.1  PLT 142* 148* 183 218    Cardiac Enzymes: No results for input(s): CKTOTAL, CKMB, CKMBINDEX, TROPONINI in the last 168 hours.  BNP (last 3 results) Recent Labs    07/27/20 1706  BNP 126.3*    ProBNP (last 3 results) No results for input(s): PROBNP in the last 8760 hours.  Radiological Exams: DG CHEST PORT 1 VIEW  Result Date: 08/01/2020 CLINICAL DATA:  Respiratory failure EXAM: PORTABLE CHEST 1 VIEW COMPARISON:  07/27/2020 FINDINGS: Cardiac shadow is stable. The previously seen pneumomediastinum is again identified but decreased. Patchy airspace opacities are again identified relatively stable from the prior exam. No sizable effusion is seen. No definitive pneumothorax is noted. The bony structures appear stable. IMPRESSION: Patchy airspace opacities similar to that seen on the prior exam consistent with the given clinical history. Electronically Signed   By: 07/29/2020 M.D.   On: 08/01/2020 15:33   ECHOCARDIOGRAM COMPLETE  Result Date: 08/01/2020    ECHOCARDIOGRAM REPORT   Patient Name:   Amanda Pittman Date of Exam: 08/01/2020 Medical Rec #:  13/11/2019          Height:       65.0 in Accession #:  [id number]         Weight:       152.0 lb Date of Birth:  Nov 06, 1950           BSA:          1.760 m Patient Age:    69 years           BP:           134/66 mmHg Patient Gender: F                  HR:           97 bpm. Exam Location:  Inpatient Procedure: 2D Echo Indications:    786.09 dyspnea  History:        Patient has no prior history of Echocardiogram examinations.                 Risk Factors:Hypertension and Dyslipidemia. Acute on chronic                 respiratory failure with hypoxia                 COVID-19 virus infection                 Bacterial lobar pneumonia                 worsening infiltrates due to covid  infection.  Sonographer:    Celene Skeen RDCS (AE) Referring Phys: [phone] Lynzie Cliburn A Lamiyah Schlotter  Sonographer Comments: No apical window, no parasternal window and Technically difficult study due to poor echo windows. IMPRESSIONS  1. Extremely limited echo with poor windows.  2. Left ventricular ejection fraction could not be assessed. Left ventricular endocardial border not optimally defined to evaluate regional wall motion. Left ventricular diastolic parameters are indeterminate.  3. Right ventricular systolic function is hyperdynamic. The right ventricular size is normal.  4. The mitral valve was not assessed. No evidence of mitral valve regurgitation.  5. The aortic valve was not assessed. Aortic valve regurgitation is not visualized.  6. The inferior vena cava is normal in size with greater than 50% respiratory variability, suggesting right atrial pressure of 3 mmHg. Conclusion(s)/Recommendation(s): Very limited images and windows. Significant artifact. Consider alternate modality to evalute LV function if necessary. FINDINGS  Left Ventricle: Left ventricular ejection fraction could not adequately be assesed. Left ventricular endocardial border not optimally defined to evaluate regional wall motion. Left ventricular diastolic parameters are indeterminate. Right Ventricle: The right ventricular size is normal. No increase in right ventricular wall thickness. Right ventricular systolic function is hyperdynamic. Left Atrium: Left atrial size was not assessed. Right Atrium: Right atrial size was not assessed. Pericardium: The pericardium was not assessed. Mitral Valve: The mitral valve was not assessed. No evidence of mitral valve regurgitation. Tricuspid Valve: The tricuspid valve is not assessed. Tricuspid valve regurgitation is not demonstrated. Aortic Valve: The aortic valve was not assessed. Aortic valve regurgitation is not visualized. Pulmonic Valve: The pulmonic valve was not assessed. Pulmonic valve regurgitation  is not visualized. Aorta: Aortic root could not be assessed. Venous: The inferior vena cava is normal in size with greater than 50% respiratory variability, suggesting right atrial pressure of 3 mmHg. IAS/Shunts: The interatrial septum was not assessed. Zoila Shutter MD Electronically signed by Zoila Shutter MD Signature Date/Time: 08/01/2020/5:02:16 PM    Final     Assessment/Plan Active Problems:   Acute on chronic respiratory failure with hypoxia (HCC)   COVID-19 virus infection  Bacterial lobar pneumonia   Severe hypoxemia   1. Acute on chronic respiratory failure with hypoxia we will continue with oxygen therapy titrate down as tolerated right now she is quite borderline.  In addition she has been using the BiPAP at nighttime which should be continued 2. COVID-19 virus infection in recovery we will continue with supportive care. 3. Bacterial lobar pneumonia has been treated continue to follow up on x-rays. 4. Severe hypoxia despite therapy still persists echocardiogram results as noted above I am going to also ask cardiology to take a look at the patient.   I have personally seen and evaluated the patient, evaluated laboratory and imaging results, formulated the assessment and plan and placed orders. The Patient requires high complexity decision making with multiple systems involvement.  Rounds were done with the Respiratory Therapy Director and Staff therapists and discussed with nursing staff also.  Yevonne Pax, MD [facility] Pulmonary Critical Care Medicine Sleep Medicine

## 2020-08-04 ENCOUNTER — Other Ambulatory Visit (HOSPITAL_COMMUNITY): Payer: Medicare Other

## 2020-08-04 DIAGNOSIS — J159 Unspecified bacterial pneumonia: Secondary | ICD-10-CM | POA: Diagnosis not present

## 2020-08-04 DIAGNOSIS — J9621 Acute and chronic respiratory failure with hypoxia: Secondary | ICD-10-CM | POA: Diagnosis not present

## 2020-08-04 DIAGNOSIS — R0902 Hypoxemia: Secondary | ICD-10-CM | POA: Diagnosis not present

## 2020-08-04 DIAGNOSIS — U071 COVID-19: Secondary | ICD-10-CM | POA: Diagnosis not present

## 2020-08-04 LAB — POTASSIUM: Potassium: 4 mmol/L (ref 3.5–5.1)

## 2020-08-04 NOTE — Progress Notes (Signed)
Pulmonary Critical Care Medicine [facility] [place]   PULMONARY CRITICAL CARE SERVICE  PROGRESS NOTE  Date of Service: 08/04/2020  Amanda Pittman  JQZ:[id number]  DOB: May 29, 1951   DOA: 07/19/2020  Referring Physician: Carron Curie, MD  HPI: Amanda Pittman is a 69 y.o. female seen for follow up of Acute on Chronic Respiratory Failure.  Patient right now is on 7 L Oxymizer good saturations are noted  Medications: Reviewed on Rounds  Physical Exam:  Vitals: Temperature is 97.6 pulse 111 respiratory rate 28 blood pressure is 98/58 saturations 96%  Ventilator Settings off the ventilator right now is down to 7 L Oxymizer  . General: Comfortable at this time . Eyes: Grossly normal lids, irises & conjunctiva . ENT: grossly tongue is normal . Neck: no obvious mass . Cardiovascular: S1 S2 normal no gallop . Respiratory: No rhonchi very coarse breath sounds . Abdomen: soft . Skin: no rash seen on limited exam . Musculoskeletal: not rigid . Psychiatric:unable to assess . Neurologic: no seizure no involuntary movements         Lab Data:   Basic Metabolic Panel: Recent Labs  Lab 07/29/20 0754 08/01/20 0404 08/03/20 0416 08/04/20 0247  NA 138 140 140  --   K 3.5 3.3* 3.2* 4.0  CL 94* 96* 99  --   CO2 28 27 26   --   GLUCOSE 198* 243* 356*  --   BUN 64* 72* 55*  --   CREATININE 0.83 0.87 0.87  --   CALCIUM 10.2 10.0 9.6  --   MG 2.7*  --   --   --     ABG: No results for input(s): PHART, PCO2ART, PO2ART, HCO3, O2SAT in the last 168 hours.  Liver Function Tests: No results for input(s): AST, ALT, ALKPHOS, BILITOT, PROT, ALBUMIN in the last 168 hours. No results for input(s): LIPASE, AMYLASE in the last 168 hours. No results for input(s): AMMONIA in the last 168 hours.  CBC: Recent Labs  Lab 07/29/20 0754 08/01/20 0404 08/03/20 0416  WBC 9.1 16.9* 8.7  HGB 14.8 13.9 13.2  HCT 45.7 43.8 42.1  MCV 84.9 86.6 88.1  PLT 148* 183 218    Cardiac  Enzymes: No results for input(s): CKTOTAL, CKMB, CKMBINDEX, TROPONINI in the last 168 hours.  BNP (last 3 results) Recent Labs    07/27/20 1706  BNP 126.3*    ProBNP (last 3 results) No results for input(s): PROBNP in the last 8760 hours.  Radiological Exams: DG Abd Portable 1V  Result Date: 08/03/2020 CLINICAL DATA:  Nasogastric tube placement. EXAM: PORTABLE ABDOMEN - 1 VIEW COMPARISON:  None. FINDINGS: Nasogastric tube extending into the stomach with its tip in the region of the duodenal bulb or distal pylorus. The included bowel gas pattern is normal. Mild linear and patchy density in the left lower lung zone and mild linear density in the right lower lung zone. Cholecystectomy clips. Extensive thoracolumbar spine degenerative changes and mild scoliosis. IMPRESSION: 1. Nasogastric tube tip in the region of the duodenal bulb or distal pylorus. 2. Mild bibasilar atelectasis and probable mild pneumonia on the left. Electronically Signed   By: 13/12/2019 M.D.   On: 08/03/2020 17:29    Assessment/Plan Active Problems:   Acute on chronic respiratory failure with hypoxia (HCC)   COVID-19 virus infection   Bacterial lobar pneumonia   Severe hypoxemia   1. Acute on chronic respiratory failure with hypoxia we'll continue with the weaning of her oxygen seems to  be doing better now with 7 L we'll continue with supportive care 2. COVID-19 virus infection in recovery 3. bacterial pneumonia has been treated we'll continue to follow 4. severe hypoxia no change continue with supportive care   I have personally seen and evaluated the patient, evaluated laboratory and imaging results, formulated the assessment and plan and placed orders. The Patient requires high complexity decision making with multiple systems involvement.  Rounds were done with the Respiratory Therapy Director and Staff therapists and discussed with nursing staff also.  Yevonne Pax, MD [facility] Pulmonary Critical Care  Medicine Sleep Medicine

## 2020-08-06 DIAGNOSIS — J9621 Acute and chronic respiratory failure with hypoxia: Secondary | ICD-10-CM | POA: Diagnosis not present

## 2020-08-06 DIAGNOSIS — U071 COVID-19: Secondary | ICD-10-CM | POA: Diagnosis not present

## 2020-08-06 DIAGNOSIS — J159 Unspecified bacterial pneumonia: Secondary | ICD-10-CM | POA: Diagnosis not present

## 2020-08-06 DIAGNOSIS — R0902 Hypoxemia: Secondary | ICD-10-CM | POA: Diagnosis not present

## 2020-08-06 LAB — CBC
HCT: 43.3 % (ref 36.0–46.0)
Hemoglobin: 13.8 g/dL (ref 12.0–15.0)
MCH: 28.2 pg (ref 26.0–34.0)
MCHC: 31.9 g/dL (ref 30.0–36.0)
MCV: 88.5 fL (ref 80.0–100.0)
Platelets: 357 10*3/uL (ref 150–400)
RBC: 4.89 MIL/uL (ref 3.87–5.11)
RDW: 17.1 % — ABNORMAL HIGH (ref 11.5–15.5)
WBC: 12.7 10*3/uL — ABNORMAL HIGH (ref 4.0–10.5)
nRBC: 0.2 % (ref 0.0–0.2)

## 2020-08-06 NOTE — Progress Notes (Signed)
Pulmonary Critical Care Medicine [facility] [place]   PULMONARY CRITICAL CARE SERVICE  PROGRESS NOTE  Date of Service: 08/06/2020  Amanda Pittman  KGM:[id number]  DOB: November 17, 1950   DOA: 07/19/2020  Referring Physician: Carron Curie, MD  HPI: Amanda Pittman is a 69 y.o. female seen for follow up of Acute on Chronic Respiratory Failure.  Continue to gradually wean the FiO2 when he is on 5 L Oxymizer good saturations are noted.  Medications: Reviewed on Rounds  Physical Exam:  Vitals: Temperature is 96.9 pulse 67 respiratory rate 14 blood pressure is 130/78 saturations 94%  Ventilator Settings on 5 L Oxymizer  . General: Comfortable at this time . Eyes: Grossly normal lids, irises & conjunctiva . ENT: grossly tongue is normal . Neck: no obvious mass . Cardiovascular: S1 S2 normal no gallop . Respiratory: No rhonchi no rales noted at this time . Abdomen: soft . Skin: no rash seen on limited exam . Musculoskeletal: not rigid . Psychiatric:unable to assess . Neurologic: no seizure no involuntary movements         Lab Data:   Basic Metabolic Panel: Recent Labs  Lab 08/01/20 0404 08/03/20 0416 08/04/20 0247  NA 140 140  --   K 3.3* 3.2* 4.0  CL 96* 99  --   CO2 27 26  --   GLUCOSE 243* 356*  --   BUN 72* 55*  --   CREATININE 0.87 0.87  --   CALCIUM 10.0 9.6  --     ABG: No results for input(s): PHART, PCO2ART, PO2ART, HCO3, O2SAT in the last 168 hours.  Liver Function Tests: No results for input(s): AST, ALT, ALKPHOS, BILITOT, PROT, ALBUMIN in the last 168 hours. No results for input(s): LIPASE, AMYLASE in the last 168 hours. No results for input(s): AMMONIA in the last 168 hours.  CBC: Recent Labs  Lab 08/01/20 0404 08/03/20 0416  WBC 16.9* 8.7  HGB 13.9 13.2  HCT 43.8 42.1  MCV 86.6 88.1  PLT 183 218    Cardiac Enzymes: No results for input(s): CKTOTAL, CKMB, CKMBINDEX, TROPONINI in the last 168 hours.  BNP (last 3  results) Recent Labs    07/27/20 1706  BNP 126.3*    ProBNP (last 3 results) No results for input(s): PROBNP in the last 8760 hours.  Radiological Exams: CT ABDOMEN WO CONTRAST  Result Date: 08/04/2020 CLINICAL DATA:  69 year old female referred for possible gastrostomy time EXAM: CT ABDOMEN WITHOUT CONTRAST TECHNIQUE: Multidetector CT imaging of the abdomen was performed following the standard protocol without IV contrast. COMPARISON:  Chest CT 07/27/2020 FINDINGS: Lower chest: Persisting mediastinal gas in the lower chest, present on the prior CT of 1028. Similar distribution of mixed reticulonodular and ground-glass opacities of bilateral lungs. No pneumothorax. Hepatobiliary: Diffusely decreased liver attenuation. Cholecystectomy. Pancreas: Unremarkable Spleen: Unremarkable Adrenals/Urinary Tract: Unremarkable appearance of the adrenal glands. Unremarkable appearance of the bilateral kidneys. Stomach/Bowel: Gastric tube terminates within the stomach which is decompressed. Otherwise unremarkable stomach. Visualized small bowel and colon unremarkable. Vascular/Lymphatic: Atherosclerotic changes the aorta. No adenopathy. Other: Injection granulomas of the superficial abdominal wall Musculoskeletal: No acute displaced fracture. Degenerative changes of the visualized thoracolumbar spine. Posterior disc osteophyte complex at L2-L3 narrows the canal by 50%. IMPRESSION: No acute finding in the abdomen. Persisting mediastinal gas, which was present on the prior CT. Similar appearance of the lungs, with changes compatible with COVID pneumonia. Electronically Signed   By: Gilmer Mor D.O.   On: 08/04/2020 14:43  Assessment/Plan Active Problems:   Acute on chronic respiratory failure with hypoxia (HCC)   COVID-19 virus infection   Bacterial lobar pneumonia   Severe hypoxemia   1. Acute on chronic respiratory failure with hypoxia continue with 5 L Oxymizer titrate as tolerated continue pulmonary  toilet 2. COVID-19 virus infection in recovery we will continue with supportive care 3. Bacterial lobar pneumonia treated improving 4. Severe hypoxia at baseline we will continue to monitor   I have personally seen and evaluated the patient, evaluated laboratory and imaging results, formulated the assessment and plan and placed orders. The Patient requires high complexity decision making with multiple systems involvement.  Rounds were done with the Respiratory Therapy Director and Staff therapists and discussed with nursing staff also.  Yevonne Pax, MD [facility] Pulmonary Critical Care Medicine Sleep Medicine

## 2020-08-07 LAB — BASIC METABOLIC PANEL
Anion gap: 14 (ref 5–15)
BUN: 66 mg/dL — ABNORMAL HIGH (ref 8–23)
CO2: 33 mmol/L — ABNORMAL HIGH (ref 22–32)
Calcium: 10.3 mg/dL (ref 8.9–10.3)
Chloride: 96 mmol/L — ABNORMAL LOW (ref 98–111)
Creatinine, Ser: 0.78 mg/dL (ref 0.44–1.00)
GFR, Estimated: >60 mL/min (ref >60)
Glucose, Bld: 333 mg/dL — ABNORMAL HIGH (ref 70–99)
Potassium: 5.1 mmol/L (ref 3.5–5.1)
Sodium: 143 mmol/L (ref 135–145)

## 2020-08-07 LAB — CBC
HCT: 45.4 % (ref 36.0–46.0)
Hemoglobin: 14.1 g/dL (ref 12.0–15.0)
MCH: 28.1 pg (ref 26.0–34.0)
MCHC: 31.1 g/dL (ref 30.0–36.0)
MCV: 90.6 fL (ref 80.0–100.0)
Platelets: 347 10*3/uL (ref 150–400)
RBC: 5.01 MIL/uL (ref 3.87–5.11)
RDW: 17.3 % — ABNORMAL HIGH (ref 11.5–15.5)
WBC: 12.6 10*3/uL — ABNORMAL HIGH (ref 4.0–10.5)
nRBC: 0.2 % (ref 0.0–0.2)

## 2020-08-07 LAB — PROTIME-INR
INR: 1.1 (ref 0.8–1.2)
Prothrombin Time: 13.7 seconds (ref 11.4–15.2)

## 2020-08-07 NOTE — Consult Note (Signed)
Chief Complaint: Patient was seen in consultation today for dysphagia/percutaneous gastrostomy tube placement.  Referring Physician(s): Carron Curie [facility])  Supervising Physician: Irish Lack  Patient Status: SSH-inpt  History of Present Illness: Amanda Pittman is a 69 y.o. female with a past medical history of hypertension, hyperlipidemia, pneumonia, COVID-19 infection and rectal cancer. She has been admitted to [facility] since 07/19/2020 for management of respiratory failure and aspiration pneumonia. She currently is receiving nutritional support via NG tube.  IR consulted by Dr. Sharyon Medicus for possible image-guided percutaneous gastrostomy tube placement. Patient awake and alert sitting in bed with no complaints at this time. Denies fever, chills, chest pain, dyspnea, abdominal pain, or headache.   Past Medical History:  Diagnosis Date  . Acute on chronic respiratory failure with hypoxia (HCC)   . Bacterial lobar pneumonia   . COVID-19 virus infection   . Severe hypoxemia     Allergies: Patient has no allergy information on record.  Medications: Prior to Admission medications   Not on File     No family history on file.  Social History   Socioeconomic History  . Marital status: Unknown    Spouse name: Not on file  . Number of children: Not on file  . Years of education: Not on file  . Highest education level: Not on file  Occupational History  . Not on file  Tobacco Use  . Smoking status: Not on file  Substance and Sexual Activity  . Alcohol use: Not on file  . Drug use: Not on file  . Sexual activity: Not on file  Other Topics Concern  . Not on file  Social History Narrative  . Not on file   Social Determinants of Health   Financial Resource Strain:   . Difficulty of Paying Living Expenses: Not on file  Food Insecurity:   . Worried About Programme researcher, broadcasting/film/video in the Last Year: Not on file  . Ran Out of Food in the Last Year: Not on file    Transportation Needs:   . Lack of Transportation (Medical): Not on file  . Lack of Transportation (Non-Medical): Not on file  Physical Activity:   . Days of Exercise per Week: Not on file  . Minutes of Exercise per Session: Not on file  Stress:   . Feeling of Stress : Not on file  Social Connections:   . Frequency of Communication with Friends and Family: Not on file  . Frequency of Social Gatherings with Friends and Family: Not on file  . Attends Religious Services: Not on file  . Active Member of Clubs or Organizations: Not on file  . Attends Banker Meetings: Not on file  . Marital Status: Not on file     Review of Systems: A 12 point ROS discussed and pertinent positives are indicated in the HPI above.  All other systems are negative.  Review of Systems  Constitutional: Negative for chills and fever.  Respiratory: Negative for shortness of breath and wheezing.   Cardiovascular: Negative for chest pain and palpitations.  Gastrointestinal: Negative for abdominal pain.  Neurological: Negative for headaches.  Psychiatric/Behavioral: Negative for behavioral problems and confusion.    Vital Signs: There were no vitals taken for this visit.  Physical Exam Vitals and nursing note reviewed.  Constitutional:      General: She is not in acute distress. Cardiovascular:     Rate and Rhythm: Normal rate and regular rhythm.     Heart sounds: Normal heart  sounds. No murmur heard.   Pulmonary:     Effort: Pulmonary effort is normal. No respiratory distress.     Breath sounds: Normal breath sounds. No wheezing.  Skin:    General: Skin is warm and dry.  Neurological:     Mental Status: She is alert and oriented to person, place, and time.      MD Evaluation Airway: WNL Heart: WNL Abdomen: WNL Chest/ Lungs: WNL ASA  Classification: 3 Mallampati/Airway Score: Two   Imaging: CT ABDOMEN WO CONTRAST  Result Date: 08/04/2020 CLINICAL DATA:  69 year old female  referred for possible gastrostomy time EXAM: CT ABDOMEN WITHOUT CONTRAST TECHNIQUE: Multidetector CT imaging of the abdomen was performed following the standard protocol without IV contrast. COMPARISON:  Chest CT 07/27/2020 FINDINGS: Lower chest: Persisting mediastinal gas in the lower chest, present on the prior CT of 1028. Similar distribution of mixed reticulonodular and ground-glass opacities of bilateral lungs. No pneumothorax. Hepatobiliary: Diffusely decreased liver attenuation. Cholecystectomy. Pancreas: Unremarkable Spleen: Unremarkable Adrenals/Urinary Tract: Unremarkable appearance of the adrenal glands. Unremarkable appearance of the bilateral kidneys. Stomach/Bowel: Gastric tube terminates within the stomach which is decompressed. Otherwise unremarkable stomach. Visualized small bowel and colon unremarkable. Vascular/Lymphatic: Atherosclerotic changes the aorta. No adenopathy. Other: Injection granulomas of the superficial abdominal wall Musculoskeletal: No acute displaced fracture. Degenerative changes of the visualized thoracolumbar spine. Posterior disc osteophyte complex at L2-L3 narrows the canal by 50%. IMPRESSION: No acute finding in the abdomen. Persisting mediastinal gas, which was present on the prior CT. Similar appearance of the lungs, with changes compatible with COVID pneumonia. Electronically Signed   By: Gilmer MorJaime  Wagner D.O.   On: 08/04/2020 14:43   CT CHEST W CONTRAST  Result Date: 07/27/2020 CLINICAL DATA:  Respiratory failure.  Pneumonia. EXAM: CT CHEST WITH CONTRAST TECHNIQUE: Multidetector CT imaging of the chest was performed during intravenous contrast administration. CONTRAST:  75mL OMNIPAQUE IOHEXOL 300 MG/ML  SOLN COMPARISON:  Radiography 07/25/2020 and 07/20/2020. FINDINGS: Cardiovascular: Heart size is normal.  No pericardial fluid. Mediastinum/Nodes: Pneumomediastinum. This is small to moderate in degree. No mass or lymphadenopathy. Lungs/Pleura: Widespread bilateral hazy  and patchy pulmonary infiltrates with a pattern often seen in coronavirus pneumonia. Other infectious etiologies are certainly possible. No lobar consolidation, collapse or effusion. Tiny amount of pleural air on both sides. Upper Abdomen: Previous cholecystectomy. No acute upper abdominal finding. Musculoskeletal: Ordinary thoracic degenerative changes. IMPRESSION: 1. Widespread bilateral hazy and patchy pulmonary infiltrates with a pattern often seen in coronavirus pneumonia. Other infectious etiologies are possible. No lobar consolidation, collapse or effusion. Moderate pneumomediastinum. Tiny amount of pleural air on both sides, the most minimal detectable. Electronically Signed   By: Paulina FusiMark  Shogry M.D.   On: 07/27/2020 14:19   DG CHEST PORT 1 VIEW  Result Date: 08/01/2020 CLINICAL DATA:  Respiratory failure EXAM: PORTABLE CHEST 1 VIEW COMPARISON:  07/27/2020 FINDINGS: Cardiac shadow is stable. The previously seen pneumomediastinum is again identified but decreased. Patchy airspace opacities are again identified relatively stable from the prior exam. No sizable effusion is seen. No definitive pneumothorax is noted. The bony structures appear stable. IMPRESSION: Patchy airspace opacities similar to that seen on the prior exam consistent with the given clinical history. Electronically Signed   By: Alcide CleverMark  Lukens M.D.   On: 08/01/2020 15:33   DG CHEST PORT 1 VIEW  Result Date: 07/25/2020 CLINICAL DATA:  Shortness of breath, respiratory failure EXAM: PORTABLE CHEST 1 VIEW COMPARISON:  Portable exam 1236 hours compared to 07/20/2020 FINDINGS: Normal heart size, mediastinal contours, and  pulmonary vascularity. Central peribronchial thickening with patchy BILATERAL pulmonary infiltrates in the mid to lower lungs most consistent with multifocal pneumonia. Infiltrates appear grossly unchanged since previous exam. No pleural effusion or pneumothorax. Osseous structures unremarkable. IMPRESSION: Bronchitic changes  with patchy BILATERAL pulmonary infiltrates in the mid to lower lungs consistent with multifocal pneumonia, grossly unchanged. Electronically Signed   By: Ulyses Southward M.D.   On: 07/25/2020 13:03   DG CHEST PORT 1 VIEW  Result Date: 07/20/2020 CLINICAL DATA:  Respiratory failure. EXAM: PORTABLE CHEST 1 VIEW COMPARISON:  No prior. FINDINGS: Mediastinum hilar structures normal. Heart size normal. Prominent patchy bilateral pulmonary infiltrates consistent with multifocal pneumonia noted. Tiny left pleural effusion cannot be excluded. No pneumothorax. Degenerative change thoracic spine. Surgical clips right upper quadrant. IMPRESSION: Prominent patchy bilateral pulmonary infiltrates consistent with multifocal pneumonia. Question patient's COVID status. Tiny left pleural effusion cannot be excluded. Electronically Signed   By: Maisie Fus  Register   On: 07/20/2020 07:34   DG Abd Portable 1V  Result Date: 08/03/2020 CLINICAL DATA:  Nasogastric tube placement. EXAM: PORTABLE ABDOMEN - 1 VIEW COMPARISON:  None. FINDINGS: Nasogastric tube extending into the stomach with its tip in the region of the duodenal bulb or distal pylorus. The included bowel gas pattern is normal. Mild linear and patchy density in the left lower lung zone and mild linear density in the right lower lung zone. Cholecystectomy clips. Extensive thoracolumbar spine degenerative changes and mild scoliosis. IMPRESSION: 1. Nasogastric tube tip in the region of the duodenal bulb or distal pylorus. 2. Mild bibasilar atelectasis and probable mild pneumonia on the left. Electronically Signed   By: Beckie Salts M.D.   On: 08/03/2020 17:29   ECHOCARDIOGRAM COMPLETE  Result Date: 08/01/2020    ECHOCARDIOGRAM REPORT   Patient Name:   VALERY AMEDEE Reichow Date of Exam: 08/01/2020 Medical Rec #:  [id number]          Height:       65.0 in Accession #:    [id number]         Weight:       152.0 lb Date of Birth:  04-Aug-1951           BSA:          1.760 m Patient  Age:    69 years           BP:           134/66 mmHg Patient Gender: F                  HR:           97 bpm. Exam Location:  Inpatient Procedure: 2D Echo Indications:    786.09 dyspnea  History:        Patient has no prior history of Echocardiogram examinations.                 Risk Factors:Hypertension and Dyslipidemia. Acute on chronic                 respiratory failure with hypoxia                 COVID-19 virus infection                 Bacterial lobar pneumonia                 worsening infiltrates due to covid infection.  Sonographer:    Celene Skeen RDCS (AE) Referring Phys: [phone] [facility]  Sonographer Comments: No apical window, no parasternal window and Technically difficult study due to poor echo windows. IMPRESSIONS  1. Extremely limited echo with poor windows.  2. Left ventricular ejection fraction could not be assessed. Left ventricular endocardial border not optimally defined to evaluate regional wall motion. Left ventricular diastolic parameters are indeterminate.  3. Right ventricular systolic function is hyperdynamic. The right ventricular size is normal.  4. The mitral valve was not assessed. No evidence of mitral valve regurgitation.  5. The aortic valve was not assessed. Aortic valve regurgitation is not visualized.  6. The inferior vena cava is normal in size with greater than 50% respiratory variability, suggesting right atrial pressure of 3 mmHg. Conclusion(s)/Recommendation(s): Very limited images and windows. Significant artifact. Consider alternate modality to evalute LV function if necessary. FINDINGS  Left Ventricle: Left ventricular ejection fraction could not adequately be assesed. Left ventricular endocardial border not optimally defined to evaluate regional wall motion. Left ventricular diastolic parameters are indeterminate. Right Ventricle: The right ventricular size is normal. No increase in right ventricular wall thickness. Right ventricular systolic function is  hyperdynamic. Left Atrium: Left atrial size was not assessed. Right Atrium: Right atrial size was not assessed. Pericardium: The pericardium was not assessed. Mitral Valve: The mitral valve was not assessed. No evidence of mitral valve regurgitation. Tricuspid Valve: The tricuspid valve is not assessed. Tricuspid valve regurgitation is not demonstrated. Aortic Valve: The aortic valve was not assessed. Aortic valve regurgitation is not visualized. Pulmonic Valve: The pulmonic valve was not assessed. Pulmonic valve regurgitation is not visualized. Aorta: Aortic root could not be assessed. Venous: The inferior vena cava is normal in size with greater than 50% respiratory variability, suggesting right atrial pressure of 3 mmHg. IAS/Shunts: The interatrial septum was not assessed. Zoila Shutter MD Electronically signed by Zoila Shutter MD Signature Date/Time: 08/01/2020/5:02:16 PM    Final     Labs:  CBC: Recent Labs    08/01/20 0404 08/03/20 0416 08/06/20 1604 08/07/20 0606  WBC 16.9* 8.7 12.7* 12.6*  HGB 13.9 13.2 13.8 14.1  HCT 43.8 42.1 43.3 45.4  PLT 183 218 357 347    COAGS: No results for input(s): INR, APTT in the last 8760 hours.  BMP: Recent Labs    07/29/20 0754 07/29/20 0754 08/01/20 0404 08/03/20 0416 08/04/20 0247 08/07/20 0606  NA 138  --  140 140  --  143  K 3.5   < > 3.3* 3.2* 4.0 5.1  CL 94*  --  96* 99  --  96*  CO2 28  --  27 26  --  33*  GLUCOSE 198*  --  243* 356*  --  333*  BUN 64*  --  72* 55*  --  66*  CALCIUM 10.2  --  10.0 9.6  --  10.3  CREATININE 0.83  --  0.87 0.87  --  0.78  GFRNONAA >60  --  >60 >60  --  >60   < > = values in this interval not displayed.    LIVER FUNCTION TESTS: Recent Labs    07/20/20 0343  BILITOT 0.8  AST 28  ALT 36  ALKPHOS 28*  PROT 4.7*  ALBUMIN 2.8*     Assessment and Plan:  Dysphagia. Plan for image-guided percutaneous gastrostomy tube placement in IR tentatively for tomorrow 08/08/2020 pending IR  scheduling. Patient will be NPO at midnight. Afebrile. Lovenox held per IR protocol. INR pending.  Risks and benefits discussed with the patient including, but not limited to  the need for a barium enema during the procedure, bleeding, infection, peritonitis, or damage to adjacent structures. All of the patient's questions were answered, patient is agreeable to proceed. Consent signed and in IR control room.   Thank you for this interesting consult.  I greatly enjoyed meeting Amanda Pittman and look forward to participating in their care.  A copy of this report was sent to the requesting provider on this date.  Electronically Signed: Elwin Mocha, PA-C 08/07/2020, 2:51 PM   I spent a total of 40 Minutes in face to face in clinical consultation, greater than 50% of which was counseling/coordinating care for dysphagia/percutaneous gastrostomy tube placement.

## 2020-08-08 ENCOUNTER — Other Ambulatory Visit (HOSPITAL_COMMUNITY): Payer: Medicare Other

## 2020-08-08 HISTORY — PX: IR GASTROSTOMY TUBE MOD SED: IMG625

## 2020-08-08 MED ORDER — LIDOCAINE HCL 1 % IJ SOLN
INTRAMUSCULAR | Status: AC
  Filled 2020-08-08: qty 20

## 2020-08-08 MED ORDER — MIDAZOLAM HCL 2 MG/2ML IJ SOLN
INTRAMUSCULAR | Status: AC
  Filled 2020-08-08: qty 2

## 2020-08-08 MED ORDER — GLUCAGON HCL RDNA (DIAGNOSTIC) 1 MG IJ SOLR
INTRAMUSCULAR | Status: AC
  Filled 2020-08-08: qty 1

## 2020-08-08 MED ORDER — CEFAZOLIN SODIUM-DEXTROSE 2-4 GM/100ML-% IV SOLN
INTRAVENOUS | Status: AC
  Filled 2020-08-08: qty 100

## 2020-08-08 MED ORDER — FENTANYL CITRATE (PF) 100 MCG/2ML IJ SOLN
INTRAMUSCULAR | Status: AC
  Filled 2020-08-08: qty 2

## 2020-08-08 MED ORDER — CEFAZOLIN SODIUM-DEXTROSE 2-4 GM/100ML-% IV SOLN
2.0000 g | Freq: Once | INTRAVENOUS | Status: AC
  Administered 2020-08-08: 2 g via INTRAVENOUS

## 2020-08-08 MED ORDER — GLUCAGON HCL (RDNA) 1 MG IJ SOLR
INTRAMUSCULAR | Status: AC | PRN
  Administered 2020-08-08: 1 mg via INTRAVENOUS

## 2020-08-08 MED ORDER — IOHEXOL 300 MG/ML  SOLN
50.0000 mL | Freq: Once | INTRAMUSCULAR | Status: AC | PRN
  Administered 2020-08-08: 15 mL

## 2020-08-08 MED ORDER — LIDOCAINE HCL 1 % IJ SOLN
INTRAMUSCULAR | Status: AC | PRN
  Administered 2020-08-08: 10 mL

## 2020-08-08 MED ORDER — FENTANYL CITRATE (PF) 100 MCG/2ML IJ SOLN
INTRAMUSCULAR | Status: AC | PRN
Start: 2020-08-08 — End: 2020-08-08
  Administered 2020-08-08 (×2): 12.5 ug via INTRAVENOUS

## 2020-08-08 MED ORDER — MIDAZOLAM HCL 2 MG/2ML IJ SOLN
INTRAMUSCULAR | Status: AC | PRN
  Administered 2020-08-08: 0.5 mg via INTRAVENOUS

## 2020-08-08 NOTE — Procedures (Signed)
Interventional Radiology Procedure Note  Procedure: Placement of percutaneous 20F pull-through gastrostomy tube. Complications: None Recommendations: - NPO except for sips and chips remainder of today and overnight - Maintain G-tube to LWS until tomorrow morning  - May advance diet as tolerated and begin using tube tomorrow morning  Signed,  Tao Satz K. Taraya Steward, MD   

## 2020-08-08 NOTE — Sedation Documentation (Signed)
Patient transported to 5 east with transporter. Alert and oriented. Vitals WNL

## 2020-08-09 NOTE — Progress Notes (Signed)
   Percutaneous G tube placed in IR 11/9  Doing well G tube intact Clean and dry Afeb +BS  May use G tube now

## 2020-08-10 LAB — CBC
HCT: 41.1 % (ref 36.0–46.0)
Hemoglobin: 13 g/dL (ref 12.0–15.0)
MCH: 28.2 pg (ref 26.0–34.0)
MCHC: 31.6 g/dL (ref 30.0–36.0)
MCV: 89.2 fL (ref 80.0–100.0)
Platelets: 244 10*3/uL (ref 150–400)
RBC: 4.61 MIL/uL (ref 3.87–5.11)
RDW: 17.1 % — ABNORMAL HIGH (ref 11.5–15.5)
WBC: 14.1 10*3/uL — ABNORMAL HIGH (ref 4.0–10.5)
nRBC: 0 % (ref 0.0–0.2)

## 2020-08-10 LAB — BASIC METABOLIC PANEL
Anion gap: 13 (ref 5–15)
BUN: 51 mg/dL — ABNORMAL HIGH (ref 8–23)
CO2: 30 mmol/L (ref 22–32)
Calcium: 10 mg/dL (ref 8.9–10.3)
Chloride: 96 mmol/L — ABNORMAL LOW (ref 98–111)
Creatinine, Ser: 0.64 mg/dL (ref 0.44–1.00)
GFR, Estimated: >60 mL/min (ref >60)
Glucose, Bld: 194 mg/dL — ABNORMAL HIGH (ref 70–99)
Potassium: 3.9 mmol/L (ref 3.5–5.1)
Sodium: 139 mmol/L (ref 135–145)

## 2020-08-10 LAB — NOVEL CORONAVIRUS, NAA (HOSP ORDER, SEND-OUT TO REF LAB; TAT 18-24 HRS): SARS-CoV-2, NAA: NOT DETECTED

## 2020-08-17 ENCOUNTER — Emergency Department (HOSPITAL_COMMUNITY): Payer: Medicare Other

## 2020-08-17 ENCOUNTER — Other Ambulatory Visit: Payer: Self-pay

## 2020-08-17 ENCOUNTER — Emergency Department (HOSPITAL_COMMUNITY)
Admission: EM | Admit: 2020-08-17 | Discharge: 2020-08-30 | Disposition: E | Payer: Medicare Other | Attending: Emergency Medicine | Admitting: Emergency Medicine

## 2020-08-17 DIAGNOSIS — J9601 Acute respiratory failure with hypoxia: Secondary | ICD-10-CM | POA: Diagnosis not present

## 2020-08-17 DIAGNOSIS — U071 COVID-19: Secondary | ICD-10-CM | POA: Insufficient documentation

## 2020-08-17 DIAGNOSIS — R0602 Shortness of breath: Secondary | ICD-10-CM | POA: Diagnosis present

## 2020-08-17 DIAGNOSIS — J1282 Pneumonia due to coronavirus disease 2019: Secondary | ICD-10-CM | POA: Insufficient documentation

## 2020-08-17 LAB — CBC WITH DIFFERENTIAL/PLATELET
Abs Immature Granulocytes: 0.08 10*3/uL — ABNORMAL HIGH (ref 0.00–0.07)
Basophils Absolute: 0 10*3/uL (ref 0.0–0.1)
Basophils Relative: 0 %
Eosinophils Absolute: 0 10*3/uL (ref 0.0–0.5)
Eosinophils Relative: 1 %
HCT: 28.4 % — ABNORMAL LOW (ref 36.0–46.0)
Hemoglobin: 8 g/dL — ABNORMAL LOW (ref 12.0–15.0)
Immature Granulocytes: 4 %
Lymphocytes Relative: 22 %
Lymphs Abs: 0.4 10*3/uL — ABNORMAL LOW (ref 0.7–4.0)
MCH: 28.9 pg (ref 26.0–34.0)
MCHC: 28.2 g/dL — ABNORMAL LOW (ref 30.0–36.0)
MCV: 102.5 fL — ABNORMAL HIGH (ref 80.0–100.0)
Monocytes Absolute: 0.1 10*3/uL (ref 0.1–1.0)
Monocytes Relative: 4 %
Neutro Abs: 1.4 10*3/uL — ABNORMAL LOW (ref 1.7–7.7)
Neutrophils Relative %: 69 %
Platelets: UNDETERMINED 10*3/uL (ref 150–400)
RBC: 2.77 MIL/uL — ABNORMAL LOW (ref 3.87–5.11)
RDW: 19.2 % — ABNORMAL HIGH (ref 11.5–15.5)
WBC: 2 10*3/uL — ABNORMAL LOW (ref 4.0–10.5)
nRBC: 7.2 % — ABNORMAL HIGH (ref 0.0–0.2)

## 2020-08-17 LAB — RESPIRATORY PANEL BY RT PCR (FLU A&B, COVID)
Influenza A by PCR: NEGATIVE
Influenza B by PCR: NEGATIVE
SARS Coronavirus 2 by RT PCR: NEGATIVE

## 2020-08-17 LAB — I-STAT CHEM 8, ED
BUN: 76 mg/dL — ABNORMAL HIGH (ref 8–23)
Calcium, Ion: 1.49 mmol/L — ABNORMAL HIGH (ref 1.15–1.40)
Chloride: 105 mmol/L (ref 98–111)
Creatinine, Ser: 1.5 mg/dL — ABNORMAL HIGH (ref 0.44–1.00)
Glucose, Bld: 188 mg/dL — ABNORMAL HIGH (ref 70–99)
HCT: 17 % — ABNORMAL LOW (ref 36.0–46.0)
Hemoglobin: 5.8 g/dL — CL (ref 12.0–15.0)
Potassium: 6.5 mmol/L (ref 3.5–5.1)
Sodium: 135 mmol/L (ref 135–145)
TCO2: 15 mmol/L — ABNORMAL LOW (ref 22–32)

## 2020-08-17 LAB — LACTIC ACID, PLASMA: Lactic Acid, Venous: >11 mmol/L (ref 0.5–1.9)

## 2020-08-17 LAB — C-REACTIVE PROTEIN: CRP: 6.6 mg/dL — ABNORMAL HIGH (ref <1.0)

## 2020-08-17 LAB — FIBRINOGEN: Fibrinogen: 178 mg/dL — ABNORMAL LOW (ref 210–475)

## 2020-08-17 LAB — BRAIN NATRIURETIC PEPTIDE: B Natriuretic Peptide: 72.2 pg/mL (ref 0.0–100.0)

## 2020-08-17 LAB — FERRITIN: Ferritin: >7500 ng/mL — ABNORMAL HIGH (ref 11–307)

## 2020-08-17 LAB — D-DIMER, QUANTITATIVE: D-Dimer, Quant: >20 ug/mL-FEU — ABNORMAL HIGH (ref 0.00–0.50)

## 2020-08-17 MED ORDER — LACTATED RINGERS IV BOLUS (SEPSIS)
1000.0000 mL | Freq: Once | INTRAVENOUS | Status: AC
  Administered 2020-08-17: 1000 mL via INTRAVENOUS

## 2020-08-17 MED ORDER — NOREPINEPHRINE 4 MG/250ML-% IV SOLN
INTRAVENOUS | Status: AC | PRN
  Administered 2020-08-17: 20 ug/kg/min via INTRAVENOUS

## 2020-08-17 MED ORDER — SODIUM BICARBONATE 8.4 % IV SOLN
INTRAVENOUS | Status: AC | PRN
  Administered 2020-08-17: 50 meq via INTRAVENOUS

## 2020-08-17 MED ORDER — SUCCINYLCHOLINE CHLORIDE 200 MG/10ML IV SOSY
PREFILLED_SYRINGE | INTRAVENOUS | Status: AC
  Filled 2020-08-17: qty 10

## 2020-08-17 MED ORDER — EPINEPHRINE 1 MG/10ML IJ SOSY
PREFILLED_SYRINGE | INTRAMUSCULAR | Status: AC | PRN
  Administered 2020-08-17 (×2): 1 via INTRAVENOUS

## 2020-08-17 MED ORDER — NOREPINEPHRINE 4 MG/250ML-% IV SOLN
0.0000 ug/min | INTRAVENOUS | Status: DC
  Administered 2020-08-17: 20 ug/min via INTRAVENOUS

## 2020-08-17 MED ORDER — VANCOMYCIN HCL IN DEXTROSE 1-5 GM/200ML-% IV SOLN
1000.0000 mg | Freq: Once | INTRAVENOUS | Status: AC
  Administered 2020-08-17: 1000 mg via INTRAVENOUS
  Filled 2020-08-17: qty 200

## 2020-08-17 MED ORDER — LACTATED RINGERS IV SOLN: INTRAVENOUS | Status: DC

## 2020-08-17 MED ORDER — SODIUM BICARBONATE 8.4 % IV SOLN
INTRAVENOUS | Status: AC
  Filled 2020-08-17: qty 50

## 2020-08-17 MED ORDER — FENTANYL CITRATE (PF) 100 MCG/2ML IJ SOLN
INTRAMUSCULAR | Status: AC
  Filled 2020-08-17: qty 2

## 2020-08-17 MED ORDER — ETOMIDATE 2 MG/ML IV SOLN
INTRAVENOUS | Status: AC
  Filled 2020-08-17: qty 10

## 2020-08-17 MED ORDER — CALCIUM GLUCONATE 10 % IV SOLN
INTRAVENOUS | Status: AC
  Filled 2020-08-17: qty 10

## 2020-08-17 MED ORDER — SODIUM CHLORIDE 0.9 % IV SOLN
2.0000 g | Freq: Once | INTRAVENOUS | Status: AC
  Administered 2020-08-17: 2 g via INTRAVENOUS
  Filled 2020-08-17: qty 2

## 2020-08-17 MED ORDER — LACTATED RINGERS IV BOLUS (SEPSIS): 500.0000 mL | Freq: Once | INTRAVENOUS | Status: DC

## 2020-08-17 MED ORDER — EPINEPHRINE 1 MG/10ML IJ SOSY
PREFILLED_SYRINGE | INTRAMUSCULAR | Status: AC | PRN
  Administered 2020-08-17: 1 via INTRAVENOUS

## 2020-08-17 MED ORDER — EPINEPHRINE 1 MG/10ML IJ SOSY
PREFILLED_SYRINGE | INTRAMUSCULAR | Status: AC
  Filled 2020-08-17: qty 40

## 2020-08-17 MED ORDER — ROCURONIUM BROMIDE 10 MG/ML (PF) SYRINGE
PREFILLED_SYRINGE | INTRAVENOUS | Status: AC
  Filled 2020-08-17: qty 10

## 2020-08-17 MED ORDER — CALCIUM CHLORIDE 10 % IV SOLN
INTRAVENOUS | Status: AC | PRN
  Administered 2020-08-17: 1 g via INTRAVENOUS

## 2020-08-17 MED ORDER — EPINEPHRINE 1 MG/10ML IJ SOSY
PREFILLED_SYRINGE | INTRAMUSCULAR | Status: AC
  Filled 2020-08-17: qty 10

## 2020-08-17 MED ORDER — SODIUM CHLORIDE 0.9 % IV SOLN: 2.0000 g | Freq: Once | INTRAVENOUS | Status: DC

## 2020-08-17 MED ORDER — ETOMIDATE 2 MG/ML IV SOLN
INTRAVENOUS | Status: AC
  Filled 2020-08-17: qty 20

## 2020-08-17 MED ORDER — MIDAZOLAM HCL 2 MG/2ML IJ SOLN
INTRAMUSCULAR | Status: AC
  Filled 2020-08-17: qty 4

## 2020-08-18 LAB — PATHOLOGIST SMEAR REVIEW: Path Review: 11192021

## 2020-08-22 LAB — CULTURE, BLOOD (ROUTINE X 2)
Culture: NO GROWTH
Special Requests: ADEQUATE

## 2020-08-30 NOTE — Progress Notes (Signed)
Pt terminally extubated per MD

## 2020-08-30 NOTE — Code Documentation (Signed)
-   pulse, compressions continued

## 2020-08-30 NOTE — ED Notes (Signed)
Help with cpr patient has a pulse now nurses at bedside

## 2020-08-30 NOTE — ED Notes (Signed)
CCMD at bedside  0936 Huston Foley PEA cpr started [phone] Epi given, bicarb given 0938 Agonal rhythm HR 34, no pulse 0940 pulse check, no pulses, PEA 0942 time of death

## 2020-08-30 NOTE — Code Documentation (Signed)
Patient lost pulse and CPR was initiated.

## 2020-08-30 NOTE — Sepsis Progress Note (Signed)
Sepsis protocol being followed by elink 

## 2020-08-30 NOTE — Code Documentation (Signed)
Calcium and epinephrine given. Patient in pulseless vtach.

## 2020-08-30 NOTE — ED Notes (Signed)
Pt lost pulses 1913 Code blue start-  0915 pulse check PEA Epi given 0918 0919 pulse check PEA 0920 epi given 0921 pulse check PEA 0921 bicarb given 0923 regained pulses ST

## 2020-08-30 NOTE — ED Notes (Addendum)
Positive color change, lost pulse, cpr initiated

## 2020-08-30 NOTE — Code Documentation (Signed)
Dr. Bebe Shaggy completed cardiac ultrasound and cardiac movement present.

## 2020-08-30 NOTE — Sepsis Progress Note (Signed)
Cardiac arrest x4 with subsequent withdrawal of care.  Expired at 0942 hrs

## 2020-08-30 NOTE — H&P (Addendum)
NAME:  KURSTYN LARIOS, MRN:  [id number], DOB:  1951-05-20, LOS: 0 ADMISSION DATE:  06-Sep-2020, CONSULTATION DATE: 04/16/2020 REFERRING MD: Roderic Scarce physician, CHIEF COMPLAINT: Status post cardiac arrest  Brief History   69 year old with a plethora of health issues who presented to [facility] status post COVID-18 June 2020 was severely hypoxic and coded during induction process intubation with subsequent 4 code situation resulting in termination of code and withdrawal care.  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  She was diagnosed Covid September 2021 and [facility] where she did well and was titrated down to 5 L nasal cannula.  Subsequently she was transferred to [facility] to the nursing home facility and on 2020/09/06 was transferred to the [facility], ED with severe hypoxia.  During intubation and pushing of sedation she became pulseless was coded for approximately 20 minutes multiple rounds of epi bicarb calcium with return of spontaneous circulation.  Subsequently she had lost pulses again and 1 round of epi with return of pulses.  Pulmonary critical care was called to the bedside at which time a central line was placed stat with good placement per chest x-ray.  She was refractory hypotension.  Subsequently coded for 3 more times.  She had multiple rounds of epinephrine, bicarb, calcium.  Following the fourth code decision was made by the attending physician to terminate further CPR.  Family had been updated prior to the last code and will be further updated once they arrive to the hospital.  Unfortunately did not survive. Past Medical History   Past Medical History:  Diagnosis Date  . Acute on chronic respiratory failure  with hypoxia (HCC)   . Bacterial lobar pneumonia   . COVID-19 virus infection   . Severe hypoxemia      Significant Hospital Events   04/16/2020 cardiac arrest x4 with subsequent withdrawal of care and pronounced at 0942 hrs.  Consults:    Procedures:  06-Sep-2020 intubation by EDP 09-06-20 by central line per PCM  Significant Diagnostic Tests:  NA  Micro Data:  NA  Antimicrobials:  Not applicable  Interim history/subjective:  [facility] department was sedated intubated and coded x4.  Objective   Blood pressure (!) 57/25, pulse 86, temperature 100 F (37.8 C), temperature source Axillary, resp. rate 20, height 5\' 5"  (1.651 m), weight 71.8 kg, SpO2 97 %.    Vent Mode: PRVC FiO2 (%):  [100 %] 100 % Set Rate:  [15 bmp] 15 bmp Vt Set:  [450 mL] 450 mL PEEP:  [10 cmH20] 10 cmH20 Plateau Pressure:  [30 cmH20] 30 cmH20   Intake/Output Summary (Last 24 hours) at 09-06-20 0948 Last data filed at 09/06/2020 0855 Gross per 24 hour  Intake 3000 ml  Output --  Net 3000 ml   Filed Weights   September 06, 2020 0821  Weight: 71.8 kg    Examination: General: Morbid obese female who is intubated HENT: Left IJ CVL in place Lungs: Decreased breath sounds bilaterally Cardiovascular: Heart sounds were irregular. Absent at times till time of final code now pulseless. Abdomen: Colostomy PEG are noted.Incon of  Stool. Colostomy with feces. Extremities: Positive edema Neuro: now dedeased   Resolved Hospital Problem list     Assessment & Plan:  69 year old female with a plethora of health issues that include but  not limited to history of rectal cancer with colostomy, hypertension, congestive heart failure, morbid obesity, generalized failure to thrive.  She was diagnosed Covid September 2021 and [facility] where she did well and was titrated down to 5 L nasal cannula.  Subsequently she was transferred to [facility] to the nursing  home facility and on 08-28-2020 was transferred to the [facility], ED with severe hypoxia.  During intubation and pushing of sedation she became pulseless was coded for approximately 20 minutes multiple rounds of epi bicarb calcium with return of spontaneous circulation.  Subsequently she had lost pulses again and 1 round of epi with return of pulses.  Pulmonary critical care was called to the bedside at which time a central line was placed stat with good placement per chest x-ray.  She was refractory hypotension.  Subsequently coded for 3 more times.  She had multiple rounds of epinephrine, bicarb, calcium.  Following the fourth code decision was made by the attending physician to terminate further CPR.  Family had been updated prior to the last code and will be further updated once they arrive to the hospital.  Unfortunately did not survive. Code called at 9:42 AM after multiple rounds of CPR epinephrine bicarb calcium. We await family's arrival for further information and updating.  Best practice:  Diet: N/A Pain/Anxiety/Delirium protocol (if indicated): N/A VAP protocol (if indicated): N/A DVT prophylaxis: N/A GI prophylaxis: N/A Glucose control: N/A Mobility: N/A Code Status: DNR Family Communication: Husband Eddy Summerlien updated by phone Disposition: Status post 4 cardiac arrest with with withdrawal care post fourth arrest  Labs   CBC: No results for input(s): WBC, NEUTROABS, HGB, HCT, MCV, PLT in the last 168 hours.  Basic Metabolic Panel: No results for input(s): NA, K, CL, CO2, GLUCOSE, BUN, CREATININE, CALCIUM, MG, PHOS in the last 168 hours. GFR: Estimated Creatinine Clearance: 66.8 mL/min (by C-G formula based on SCr of 0.64 mg/dL). No results for input(s): PROCALCITON, WBC, LATICACIDVEN in the last 168 hours.  Liver Function Tests: No results for input(s): AST, ALT, ALKPHOS, BILITOT, PROT, ALBUMIN in the last 168 hours. No results for input(s): LIPASE, AMYLASE in the last  168 hours. No results for input(s): AMMONIA in the last 168 hours.  ABG No results found for: PHART, PCO2ART, PO2ART, HCO3, TCO2, ACIDBASEDEF, O2SAT   Coagulation Profile: No results for input(s): INR, PROTIME in the last 168 hours.  Cardiac Enzymes: No results for input(s): CKTOTAL, CKMB, CKMBINDEX, TROPONINI in the last 168 hours.  HbA1C: No results found for: HGBA1C  CBG: No results for input(s): GLUCAP in the last 168 hours.  Review of Systems:   na  Past Medical History  She,  has a past medical history of Acute on chronic respiratory failure with hypoxia (HCC), Bacterial lobar pneumonia, COVID-19 virus infection, and Severe hypoxemia.    Critical care time: 65 min    Brett Canales Shanise Balch ACNP Acute Care Nurse Practitioner [facility] Pulmonary/Critical Care Please consult Amion August 28, 2020, 9:48 AM

## 2020-08-30 NOTE — Code Documentation (Signed)
-   pulse cpr initiated

## 2020-08-30 NOTE — ED Provider Notes (Signed)
[facility] EMERGENCY DEPARTMENT Provider Note   CSN: [id number] Arrival date & time: 09-14-2020  8003     History Chief Complaint  Patient presents with  . Covid Positive  . Pneumonia  . Shortness of Breath   Level 5 caveat due to acuity of condition/respiratory failure Amanda Pittman is a 69 y.o. female.  The history is provided by the EMS personnel. The history is limited by the condition of the patient.  Shortness of Breath Severity:  Severe Timing:  Constant Progression:  Worsening Chronicity:  New Relieved by:  Nothing Worsened by:  Nothing Patient with history of rectal CA with colostomy, history of Covid 19 presents from [facility] Patient was diagnosed with COVID-19 back in September.  She never recovered from Covid pneumonia and was placed in the long-term acute care facility.  Patient has a PEG tube in place.  Patient was appropriately treated at outside hospital for Covid, is now at the acute care facility due to continued need for oxygen/ respiratory failure.  Tonight patient was noted to have hypoxia and was sent to the ER for evaluation No other details known on arrival      Past Medical History:  Diagnosis Date  . Acute on chronic respiratory failure with hypoxia (HCC)   . Bacterial lobar pneumonia   . COVID-19 virus infection   . Severe hypoxemia     Patient Active Problem List   Diagnosis Date Noted  . Acute on chronic respiratory failure with hypoxia (HCC)   . COVID-19 virus infection   . Bacterial lobar pneumonia   . Severe hypoxemia         OB History   No obstetric history on file.     No family history on file.  Social History   Tobacco Use  . Smoking status: Not on file  Substance Use Topics  . Alcohol use: Not on file  . Drug use: Not on file    Home Medications Prior to Admission medications   Not on File    Allergies    Patient has no allergy information on record.  Review of Systems     Review of Systems  Unable to perform ROS: Severe respiratory distress  Respiratory: Positive for shortness of breath.     Physical Exam Updated Vital Signs BP (!) 150/107   Pulse (!) 140   Temp 100 F (37.8 C) (Axillary)   Resp (!) 40   SpO2 100%   Physical Exam CONSTITUTIONAL: Elderly, ill-appearing HEAD: Normocephalic/atraumatic EYES: EOMI/PERRL ENMT: Mucous membranes dry NECK: supple no meningeal signs CV: Tachycardic LUNGS: Tachypnea, respiratory distress noted, crackles bilaterally ABDOMEN: soft, PEG tube in place, colostomy in place NEURO: Pt is awake/alert will shake her head yes or no, but will not speak.  Patient is in distress EXTREMITIES: pulses normal/equal SKIN: warm, color normal, midline cathter right UE PSYCH: Unable to assess  ED Results / Procedures / Treatments   Labs (all labs ordered are listed, but only abnormal results are displayed) Labs Reviewed  RESPIRATORY PANEL BY RT PCR (FLU A&B, COVID)  CULTURE, BLOOD (ROUTINE X 2)  CULTURE, BLOOD (ROUTINE X 2)  LACTIC ACID, PLASMA  LACTIC ACID, PLASMA  CBC WITH DIFFERENTIAL/PLATELET  COMPREHENSIVE METABOLIC PANEL  D-DIMER, QUANTITATIVE (NOT AT [facility])  PROCALCITONIN  LACTATE DEHYDROGENASE  FERRITIN  TRIGLYCERIDES  FIBRINOGEN  C-REACTIVE PROTEIN  BRAIN NATRIURETIC PEPTIDE  BLOOD GAS, ARTERIAL    EKG EKG Interpretation  Date/Time:  Thursday 2020-09-14 06:36:37 EST Ventricular  Rate:  139 PR Interval:    QRS Duration: 94 QT Interval:  293 QTC Calculation: 446 R Axis:   -2 Text Interpretation: Sinus tachycardia Abnormal inferior Q waves Interpretation limited secondary to artifact Abnormal ekg Confirmed by Zadie Rhine ([id number]) on 09/05/20 6:43:56 AM   EKG Interpretation  Date/Time:  Thursday 09-05-2020 07:14:27 EST Ventricular Rate:  156 PR Interval:    QRS Duration: 131 QT Interval:  320 QTC Calculation: 516 R Axis:   -76 Text Interpretation: Wide-QRS tachycardia Right  bundle branch block Confirmed by Zadie Rhine ([id number]) on 09-05-2020 7:56:01 AM        Radiology DG Chest Port 1 View  Result Date: September 05, 2020 CLINICAL DATA:  Shortness of breath EXAM: PORTABLE CHEST 1 VIEW COMPARISON:  08/01/2020 FINDINGS: Low volume chest with streaky and hazy opacity at the bases, similar to before. Borderline cardiac enlargement. The apices are not entirely included. No visible pneumothorax or pleural effusion. IMPRESSION: 1. Worsened lung volumes compared to 08/01/2020. 2. Recent COVID pneumonia with persistent infiltrate. Electronically Signed   By: Marnee Spring M.D.   On: 2020-09-05 07:15    Procedures .Critical Care Performed by: Zadie Rhine, MD Authorized by: Zadie Rhine, MD   Critical care provider statement:    Critical care time (minutes):  35   Critical care start time:  09-05-20 6:30 AM   Critical care end time:  2020/09/05 7:05 AM   Critical care was necessary to treat or prevent imminent or life-threatening deterioration of the following conditions:  Respiratory failure, sepsis and shock   Critical care was time spent personally by me on the following activities:  Ordering and review of radiographic studies, pulse oximetry, re-evaluation of patient's condition, review of old charts, obtaining history from patient or surrogate and ordering and performing treatments and interventions   I assumed direction of critical care for this patient from another provider in my specialty: no   Procedure Name: Intubation Date/Time: 05-Sep-2020 6:40 AM Performed by: Zadie Rhine, MD Pre-anesthesia Checklist: Suction available Oxygen Delivery Method: Non-rebreather mask Preoxygenation: Pre-oxygenation with 100% oxygen Laryngoscope Size: Glidescope Tube size: 7.5 mm Number of attempts: 1 Airway Equipment and Method: Video-laryngoscopy Placement Confirmation: ETT inserted through vocal cords under direct vision,  CO2 detector and Breath sounds  checked- equal and bilateral     CPR Procedure Note I PERSONALLY DIRECTED ANCILLARY STAFF OR/PERFORMED CPR IN AN EFFORT TO REGAIN RETURN OF SPONTANEOUS CIRCULATION IN AN EFFORT TO MAINTAIN NEURO, CARDIAC AND SYSTEMIC PERFUSION   Medications Ordered in ED Medications  rocuronium bromide 100 MG/10ML SOSY (has no administration in time range)  etomidate (AMIDATE) 2 MG/ML injection (has no administration in time range)  midazolam (VERSED) 2 MG/2ML injection (has no administration in time range)  fentaNYL (SUBLIMAZE) 100 MCG/2ML injection (has no administration in time range)  succinylcholine (ANECTINE) 200 MG/10ML syringe (has no administration in time range)  etomidate (AMIDATE) 2 MG/ML injection (has no administration in time range)  lactated ringers infusion (has no administration in time range)  lactated ringers bolus 1,000 mL (has no administration in time range)    And  lactated ringers bolus 1,000 mL (has no administration in time range)    And  lactated ringers bolus 1,000 mL (has no administration in time range)    And  lactated ringers bolus 500 mL (has no administration in time range)  vancomycin (VANCOCIN) IVPB 1000 mg/200 mL premix (has no administration in time range)  ceFEPIme (MAXIPIME) 2 g in sodium chloride 0.9 %  100 mL IVPB (has no administration in time range)    ED Course  I have reviewed the triage vital signs and the nursing notes.  Pertinent  imaging results that were available during my care of the patient were reviewed by me and considered in my medical decision making (see chart for details).    MDM Rules/Calculators/A&P                          7:52 AM Patient was seen on arrival at approximately 6:30 AM.  She arrived in respiratory distress on high flow oxygen.  Patient was awake alert but was unable to speak.  Initial plan was to place patient on BiPAP to see if that improved her symptoms.  Soon after being placed on BiPAP patient acutely worsened.   Patient became unresponsive, therefore I elected to intubate the patient.  Patient went into cardiac arrest during the intubation. As soon as the airway was established, CPR was initiated given epinephrine/bicarb/calcium. Patient had ROSC after CPR.  At 1 point during resuscitation, patient did have what appeared to be ventricular tachycardia with a pulse, synchronized cardioversion was attempted with improvement of rhythm. Patient did have another episode of cardiac arrest and CPR was initiated, but at this time patient now has return of spontaneous circulation. Spoke to her husband, Link Snuffer via phone he lives in [place] He was informed of his wife's critical illness and he is driving to the hospital.  Signed out to Dr. Renaye Rakers at shift change with critical care consult pending Final Clinical Impression(s) / ED Diagnoses Final diagnoses:  Acute respiratory failure with hypoxia (HCC)  Pneumonia due to COVID-19 virus    Rx / DC Orders ED Discharge Orders    None       Zadie Rhine, MD 05-Sep-2020 [phone]

## 2020-08-30 NOTE — Procedures (Signed)
Central Venous Catheter Insertion Procedure Note Amanda Pittman [id number] 07-Sep-1951  Procedure: Insertion of Central Venous Catheter Indications: Assessment of intravascular volume, Drug and/or fluid administration and Frequent blood sampling  Procedure Details Consent: Risks of procedure as well as the alternatives and risks of each were explained to the (patient/caregiver).  Consent for procedure obtained. Time Out: Verified patient identification, verified procedure, site/side was marked, verified correct patient position, special equipment/implants available, medications/allergies/relevent history reviewed, required imaging and test results available.  Performed  Maximum sterile technique was used including antiseptics, cap, gloves, gown, hand hygiene, mask and sheet. Skin prep: Chlorhexidine; local anesthetic administered A antimicrobial bonded/coated triple lumen catheter was placed in the left internal jugular vein using the Seldinger technique. Ultrasound guidance used.Yes.   Catheter placed to 20 cm. Blood aspirated via all 3 ports and then flushed x 3. Line sutured x 2 and dressing applied.  Evaluation Blood flow good Complications: No apparent complications Patient did tolerate procedure well. Chest X-ray ordered to verify placement.  CXR: normal.  Amanda Pittman ACNP Acute Care Nurse Practitioner [facility] Pulmonary/Critical Care Please consult Amion 24-Aug-2020, 9:45 AM

## 2020-08-30 NOTE — Code Documentation (Signed)
Pulse check, negative pulse.

## 2020-08-30 NOTE — Code Documentation (Signed)
Epi given at 0657.

## 2020-08-30 NOTE — Progress Notes (Signed)
Pharmacy Antibiotic Note  Amanda Pittman is a 69 y.o. female admitted on Sep 01, 2020 with pneumonia.  Pharmacy has been consulted for Aztreonam and vancomycin dosing. Of note, patient has tolerated a cephalosporin in the past and hence aztreonam was switched to Cefepime per protocol   Plan: -Vancomycin 1 gm IV once -Cefepime 2 gm IV once  -Will await f/u labs for maintenance dosing.      Temp (24hrs), Avg:100 F (37.8 C), Min:100 F (37.8 C), Max:100 F (37.8 C)  No results for input(s): WBC, CREATININE, LATICACIDVEN, VANCOTROUGH, VANCOPEAK, VANCORANDOM, GENTTROUGH, GENTPEAK, GENTRANDOM, TOBRATROUGH, TOBRAPEAK, TOBRARND, AMIKACINPEAK, AMIKACINTROU, AMIKACIN in the last 168 hours.  CrCl cannot be calculated (Unknown ideal weight.).    Not on File  Antimicrobials this admission: Cefepime 11/18 >>  Vanc 11/18 >>   Dose adjustments this admission:  Microbiology results:   Thank you for allowing pharmacy to be a part of this patient's care.  Vinnie Level, PharmD., BCPS, BCCCP Clinical Pharmacist Please refer to [facility] for unit-specific pharmacist

## 2020-08-30 NOTE — Death Summary Note (Signed)
DEATH SUMMARY   Patient Details  Name: Amanda Pittman MRN: [id number] DOB: [birth year]  Admission/Discharge Information   Admit Date:  08/27/20  Date of Death: Date of Death: 08/27/20  Time of Death: Time of Death: 0942  Length of Stay: 0  Referring Physician: Patient, No Pcp Per   Reason(s) for Hospitalization  Respiratory Failure  Diagnoses  Preliminary cause of death:   Respiratory Failure and Shock  Secondary Diagnoses (including complications and co-morbidities):  Covid 19 05/2020 with Chronic Respiratory Failure Hypertension Rectal Cancer s/p Colostomy Reactive Airways Disease Percutaneous Gastrostomy Tube Placed 08/07/20  Brief Hospital Course (including significant findings, care, treatment, and services provided and events leading to death)  Amanda Pittman is a 68 y.o. year old female with chronic respiratory failure since Covid 19 infection in 05/2020, rectal cancer s/p colostomy, hypertension and recently placed PEG tube who presented from [facility] this morning with increasing oxygen requirements and signs of respiratory failure. Soon after arrival in the ER, she was prepared for intubation and subsequently lost her pulse after rapid induction. ROSC was achieved after 20 minutes of CPR efforts. She would then go on to lose her pulse 3 more times requiring resuscitative care. She mainly had pulseless electrical activity on pulse checks. She developed frank hemoptysis visible in her endotracheal tube and bleeding around her PEG tube so TPA therapy was not advised for concern of pulmonary emboli. Discussion was held with patient's husband about her condition and poor prognosis of surviving. Resuscitation efforts were stopped in the final code due to inability to obtain return of circulation after multiple rounds of CPR. Time of death was 9:42am.   Pertinent Labs and Studies  Significant Diagnostic Studies CT ABDOMEN WO CONTRAST  Result Date:  08/04/2020 CLINICAL DATA:  69 year old female referred for possible gastrostomy time EXAM: CT ABDOMEN WITHOUT CONTRAST TECHNIQUE: Multidetector CT imaging of the abdomen was performed following the standard protocol without IV contrast. COMPARISON:  Chest CT 07/27/2020 FINDINGS: Lower chest: Persisting mediastinal gas in the lower chest, present on the prior CT of 1028. Similar distribution of mixed reticulonodular and ground-glass opacities of bilateral lungs. No pneumothorax. Hepatobiliary: Diffusely decreased liver attenuation. Cholecystectomy. Pancreas: Unremarkable Spleen: Unremarkable Adrenals/Urinary Tract: Unremarkable appearance of the adrenal glands. Unremarkable appearance of the bilateral kidneys. Stomach/Bowel: Gastric tube terminates within the stomach which is decompressed. Otherwise unremarkable stomach. Visualized small bowel and colon unremarkable. Vascular/Lymphatic: Atherosclerotic changes the aorta. No adenopathy. Other: Injection granulomas of the superficial abdominal wall Musculoskeletal: No acute displaced fracture. Degenerative changes of the visualized thoracolumbar spine. Posterior disc osteophyte complex at L2-L3 narrows the canal by 50%. IMPRESSION: No acute finding in the abdomen. Persisting mediastinal gas, which was present on the prior CT. Similar appearance of the lungs, with changes compatible with COVID pneumonia. Electronically Signed   By: Gilmer Mor D.O.   On: 08/04/2020 14:43   CT CHEST W CONTRAST  Result Date: 07/27/2020 CLINICAL DATA:  Respiratory failure.  Pneumonia. EXAM: CT CHEST WITH CONTRAST TECHNIQUE: Multidetector CT imaging of the chest was performed during intravenous contrast administration. CONTRAST:  75mL OMNIPAQUE IOHEXOL 300 MG/ML  SOLN COMPARISON:  Radiography 07/25/2020 and 07/20/2020. FINDINGS: Cardiovascular: Heart size is normal.  No pericardial fluid. Mediastinum/Nodes: Pneumomediastinum. This is small to moderate in degree. No mass or  lymphadenopathy. Lungs/Pleura: Widespread bilateral hazy and patchy pulmonary infiltrates with a pattern often seen in coronavirus pneumonia. Other infectious etiologies are certainly possible. No lobar consolidation, collapse or effusion. Tiny amount of pleural air on both  sides. Upper Abdomen: Previous cholecystectomy. No acute upper abdominal finding. Musculoskeletal: Ordinary thoracic degenerative changes. IMPRESSION: 1. Widespread bilateral hazy and patchy pulmonary infiltrates with a pattern often seen in coronavirus pneumonia. Other infectious etiologies are possible. No lobar consolidation, collapse or effusion. Moderate pneumomediastinum. Tiny amount of pleural air on both sides, the most minimal detectable. Electronically Signed   By: Paulina Fusi M.D.   On: 07/27/2020 14:19   IR GASTROSTOMY TUBE MOD SED  Result Date: 08/08/2020 INDICATION: 69 year old female with acute on chronic respiratory failure and dysphagia. She presents for percutaneous gastrostomy tube placement. EXAM: Fluoroscopically guided placement of percutaneous pull-through gastrostomy tube Interventional Radiologist:  Sterling Big, MD MEDICATIONS: 2 g Ancef, 1 mg glucagon; Antibiotics were administered within 1 hour of the procedure. ANESTHESIA/SEDATION: Versed 0.5 mg IV; Fentanyl 25 mcg IV Moderate Sedation Time:  15 minutes The patient was continuously monitored during the procedure by the interventional radiology nurse under my direct supervision. CONTRAST:  47mL OMNIPAQUE IOHEXOL 300 MG/ML  SOLN FLUOROSCOPY TIME:  Fluoroscopy Time: 5 minutes 0 seconds (20 mGy). COMPLICATIONS: None immediate. PROCEDURE: Informed written consent was obtained from the patient after a thorough discussion of the procedural risks, benefits and alternatives. All questions were addressed. Maximal Sterile Barrier Technique was utilized including caps, mask, sterile gowns, sterile gloves, sterile drape, hand hygiene and skin antiseptic. A timeout was  performed prior to the initiation of the procedure. Maximal barrier sterile technique utilized including caps, mask, sterile gowns, sterile gloves, large sterile drape, hand hygiene, and chlorhexadine skin prep. An angled catheter was advanced over a wire under fluoroscopic guidance through the nose, down the esophagus and into the body of the stomach. The stomach was then insufflated with several 100 ml of air. Fluoroscopy confirmed location of the gastric bubble, as well as inferior displacement of the barium stained colon. Under direct fluoroscopic guidance, a single T-tack was placed, and the anterior gastric wall drawn up against the anterior abdominal wall. Percutaneous access was then obtained into the mid gastric body with an 18 gauge sheath needle. Aspiration of air, and injection of contrast material under fluoroscopy confirmed needle placement. An Amplatz wire was advanced in the gastric body and the access needle exchanged for a 9-French vascular sheath. A snare device was advanced through the vascular sheath and an Amplatz wire advanced through the angled catheter. The Amplatz wire was successfully snared and this was pulled up through the esophagus and out the mouth. A 20-French Burnell Blanks MIC-PEG tube was then connected to the snare and pulled through the mouth, down the esophagus, into the stomach and out to the anterior abdominal wall. Hand injection of contrast material confirmed intragastric location. The T-tack retention suture was then cut. The pull through peg tube was then secured with the external bumper and capped. The patient will be observed for several hours with the newly placed tube on low wall suction to evaluate for any post procedure complication. The patient tolerated the procedure well, there is no immediate complication. IMPRESSION: Successful placement of a 20 French pull through gastrostomy tube. Electronically Signed   By: Malachy Moan M.D.   On: 08/08/2020 13:26    DG Chest Portable 1 View  Result Date: 09/15/2020 CLINICAL DATA:  Follow-up for COVID-19 pneumonia. Intubated patient. New central line. EXAM: PORTABLE CHEST 1 VIEW COMPARISON:  15-Sep-2020 at 8:16 a.m., and earlier studies. FINDINGS: Left internal jugular central venous line has its tip projecting in the mid superior vena cava. No pneumothorax. Endotracheal tube and  nasal/orogastric tube are stable in well positioned. No change in the bilateral hazy airspace lung opacities. IMPRESSION: 1. New left internal jugular central venous line has its tip in the mid superior vena cava. No pneumothorax and no other change from the earlier study. Electronically Signed   By: Amie Portland M.D.   On: August 19, 2020 09:41   DG Chest Port 1 View  Result Date: August 19, 2020 CLINICAL DATA:  Shortness of breath.  History of COVID. EXAM: PORTABLE CHEST 1 VIEW COMPARISON:  08/19/2020. FINDINGS: Endotracheal tube tip noted 1 cm over the carina. Proximal repositioning of approximately 2 cm should be considered. NG tube noted with tip below left hemidiaphragm. Heart size stable. Persistent bilateral pulmonary infiltrates. No pleural effusion or pneumothorax. IMPRESSION: 1. Endotracheal tube tip noted 1 cm over the carina. Proximal repositioning of approximately 2 cm should be considered. NG tube noted with tip below left hemidiaphragm. 2. Persistent bilateral pulmonary infiltrates. Electronically Signed   By: Maisie Fus  Register   On: 19-Aug-2020 08:36   DG Chest Port 1 View  Result Date: 2020/08/19 CLINICAL DATA:  Shortness of breath EXAM: PORTABLE CHEST 1 VIEW COMPARISON:  08/01/2020 FINDINGS: Low volume chest with streaky and hazy opacity at the bases, similar to before. Borderline cardiac enlargement. The apices are not entirely included. No visible pneumothorax or pleural effusion. IMPRESSION: 1. Worsened lung volumes compared to 08/01/2020. 2. Recent COVID pneumonia with persistent infiltrate. Electronically Signed   By:  Marnee Spring M.D.   On: August 19, 2020 07:15   DG CHEST PORT 1 VIEW  Result Date: 08/01/2020 CLINICAL DATA:  Respiratory failure EXAM: PORTABLE CHEST 1 VIEW COMPARISON:  07/27/2020 FINDINGS: Cardiac shadow is stable. The previously seen pneumomediastinum is again identified but decreased. Patchy airspace opacities are again identified relatively stable from the prior exam. No sizable effusion is seen. No definitive pneumothorax is noted. The bony structures appear stable. IMPRESSION: Patchy airspace opacities similar to that seen on the prior exam consistent with the given clinical history. Electronically Signed   By: Alcide Clever M.D.   On: 08/01/2020 15:33   DG CHEST PORT 1 VIEW  Result Date: 07/25/2020 CLINICAL DATA:  Shortness of breath, respiratory failure EXAM: PORTABLE CHEST 1 VIEW COMPARISON:  Portable exam 1236 hours compared to 07/20/2020 FINDINGS: Normal heart size, mediastinal contours, and pulmonary vascularity. Central peribronchial thickening with patchy BILATERAL pulmonary infiltrates in the mid to lower lungs most consistent with multifocal pneumonia. Infiltrates appear grossly unchanged since previous exam. No pleural effusion or pneumothorax. Osseous structures unremarkable. IMPRESSION: Bronchitic changes with patchy BILATERAL pulmonary infiltrates in the mid to lower lungs consistent with multifocal pneumonia, grossly unchanged. Electronically Signed   By: Ulyses Southward M.D.   On: 07/25/2020 13:03   DG CHEST PORT 1 VIEW  Result Date: 07/20/2020 CLINICAL DATA:  Respiratory failure. EXAM: PORTABLE CHEST 1 VIEW COMPARISON:  No prior. FINDINGS: Mediastinum hilar structures normal. Heart size normal. Prominent patchy bilateral pulmonary infiltrates consistent with multifocal pneumonia noted. Tiny left pleural effusion cannot be excluded. No pneumothorax. Degenerative change thoracic spine. Surgical clips right upper quadrant. IMPRESSION: Prominent patchy bilateral pulmonary infiltrates  consistent with multifocal pneumonia. Question patient's COVID status. Tiny left pleural effusion cannot be excluded. Electronically Signed   By: Maisie Fus  Register   On: 07/20/2020 07:34   DG Abd Portable 1V  Result Date: 08/03/2020 CLINICAL DATA:  Nasogastric tube placement. EXAM: PORTABLE ABDOMEN - 1 VIEW COMPARISON:  None. FINDINGS: Nasogastric tube extending into the stomach with its tip in the region of the  duodenal bulb or distal pylorus. The included bowel gas pattern is normal. Mild linear and patchy density in the left lower lung zone and mild linear density in the right lower lung zone. Cholecystectomy clips. Extensive thoracolumbar spine degenerative changes and mild scoliosis. IMPRESSION: 1. Nasogastric tube tip in the region of the duodenal bulb or distal pylorus. 2. Mild bibasilar atelectasis and probable mild pneumonia on the left. Electronically Signed   By: Beckie Salts M.D.   On: 08/03/2020 17:29   ECHOCARDIOGRAM COMPLETE  Result Date: 08/01/2020    ECHOCARDIOGRAM REPORT   Patient Name:   DAYANIRA GIOVANNETTI Weinert Date of Exam: 08/01/2020 Medical Rec #:  [id number]          Height:       65.0 in Accession #:    [id number]         Weight:       152.0 lb Date of Birth:  Mar 09, 1951           BSA:          1.760 m Patient Age:    69 years           BP:           134/66 mmHg Patient Gender: F                  HR:           97 bpm. Exam Location:  Inpatient Procedure: 2D Echo Indications:    786.09 dyspnea  History:        Patient has no prior history of Echocardiogram examinations.                 Risk Factors:Hypertension and Dyslipidemia. Acute on chronic                 respiratory failure with hypoxia                 COVID-19 virus infection                 Bacterial lobar pneumonia                 worsening infiltrates due to covid infection.  Sonographer:    Celene Skeen RDCS (AE) Referring Phys: [id number] SAADAT A KHAN  Sonographer Comments: No apical window, no parasternal window and Technically  difficult study due to poor echo windows. IMPRESSIONS  1. Extremely limited echo with poor windows.  2. Left ventricular ejection fraction could not be assessed. Left ventricular endocardial border not optimally defined to evaluate regional wall motion. Left ventricular diastolic parameters are indeterminate.  3. Right ventricular systolic function is hyperdynamic. The right ventricular size is normal.  4. The mitral valve was not assessed. No evidence of mitral valve regurgitation.  5. The aortic valve was not assessed. Aortic valve regurgitation is not visualized.  6. The inferior vena cava is normal in size with greater than 50% respiratory variability, suggesting right atrial pressure of 3 mmHg. Conclusion(s)/Recommendation(s): Very limited images and windows. Significant artifact. Consider alternate modality to evalute LV function if necessary. FINDINGS  Left Ventricle: Left ventricular ejection fraction could not adequately be assesed. Left ventricular endocardial border not optimally defined to evaluate regional wall motion. Left ventricular diastolic parameters are indeterminate. Right Ventricle: The right ventricular size is normal. No increase in right ventricular wall thickness. Right ventricular systolic function is hyperdynamic. Left Atrium: Left atrial size was not assessed. Right Atrium: Right atrial size was not assessed. Pericardium:  The pericardium was not assessed. Mitral Valve: The mitral valve was not assessed. No evidence of mitral valve regurgitation. Tricuspid Valve: The tricuspid valve is not assessed. Tricuspid valve regurgitation is not demonstrated. Aortic Valve: The aortic valve was not assessed. Aortic valve regurgitation is not visualized. Pulmonic Valve: The pulmonic valve was not assessed. Pulmonic valve regurgitation is not visualized. Aorta: Aortic root could not be assessed. Venous: The inferior vena cava is normal in size with greater than 50% respiratory variability, suggesting  right atrial pressure of 3 mmHg. IAS/Shunts: The interatrial septum was not assessed. Zoila ShutterKenneth Hilty MD Electronically signed by Zoila ShutterKenneth Hilty MD Signature Date/Time: 08/01/2020/5:02:16 PM    Final     Microbiology Recent Results (from the past 240 hour(s))  Novel Coronavirus, NAA (Hosp order, Send-out to Ref Lab; TAT 18-24 hrs     Status: None   Collection Time: 08/09/20 12:19 PM   Specimen: Nasopharyngeal Swab; Respiratory  Result Value Ref Range Status   SARS-CoV-2, NAA NOT DETECTED NOT DETECTED Final    Comment: (NOTE) This nucleic acid amplification test was developed and its performance characteristics determined by [facility]. Nucleic acid amplification tests include RT-PCR and TMA. This test has not been FDA cleared or approved. This test has been authorized by FDA under an Emergency Use Authorization (EUA). This test is only authorized for the duration of time the declaration that circumstances exist justifying the authorization of the emergency use of in vitro diagnostic tests for detection of SARS-CoV-2 virus and/or diagnosis of COVID-19 infection under section 564(b)(1) of the Act, 21 U.S.C. 161WRU-0(A360bbb-3(b) (1), unless the authorization is terminated or revoked sooner. When diagnostic testing is negative, the possibility of a false negative result should be considered in the context of a patient's recent exposures and the presence of clinical signs and symptoms consistent with COVID-19. An individual without symptoms of COVID- 19 and who is not shedding SARS-CoV-2  virus would expect to have a negative (not detected) result in this assay. Performed At: [facility] [address] [place], [place] [id number] Jolene SchimkeNagendra Sanjai MD YN:[id number]:[phone]    Coronavirus Source NASOPHARYNGEAL  Final    Comment: Performed at [facility], [address] [place], [place] [address]  Respiratory Panel by RT PCR (Flu A&B, Covid) - Nasopharyngeal Swab     Status: None    Collection Time: 02/13/20  6:37 AM   Specimen: Nasopharyngeal Swab  Result Value Ref Range Status   SARS Coronavirus 2 by RT PCR NEGATIVE NEGATIVE Final    Comment: (NOTE) SARS-CoV-2 target nucleic acids are NOT DETECTED.  The SARS-CoV-2 RNA is generally detectable in upper respiratoy specimens during the acute phase of infection. The lowest concentration of SARS-CoV-2 viral copies this assay can detect is 131 copies/mL. A negative result does not preclude SARS-Cov-2 infection and should not be used as the sole basis for treatment or other patient management decisions. A negative result may occur with  improper specimen collection/handling, submission of specimen other than nasopharyngeal swab, presence of viral mutation(s) within the areas targeted by this assay, and inadequate number of viral copies (<131 copies/mL). A negative result must be combined with clinical observations, patient history, and epidemiological information. The expected result is Negative.  Fact Sheet for Patients:  https://www.moore.com/https://www.fda.gov/media/142436/download  Fact Sheet for Healthcare Providers:  https://www.young.biz/https://www.fda.gov/media/142435/download  This test is no t yet approved or cleared by the [place] FDA and  has been authorized for detection and/or diagnosis of SARS-CoV-2 by FDA under an Emergency Use Authorization (EUA).  This EUA will remain  in effect (meaning this test can be used) for the duration of the COVID-19 declaration under Section 564(b)(1) of the Act, 21 U.S.C. section 360bbb-3(b)(1), unless the authorization is terminated or revoked sooner.     Influenza A by PCR NEGATIVE NEGATIVE Final   Influenza B by PCR NEGATIVE NEGATIVE Final    Comment: (NOTE) The Xpert Xpress SARS-CoV-2/FLU/RSV assay is intended as an aid in  the diagnosis of influenza from Nasopharyngeal swab specimens and  should not be used as a sole basis for treatment. Nasal washings and  aspirates are unacceptable for Xpert  Xpress SARS-CoV-2/FLU/RSV  testing.  Fact Sheet for Patients: https://www.moore.com/  Fact Sheet for Healthcare Providers: https://www.young.biz/  This test is not yet approved or cleared by the [place] FDA and  has been authorized for detection and/or diagnosis of SARS-CoV-2 by  FDA under an Emergency Use Authorization (EUA). This EUA will remain  in effect (meaning this test can be used) for the duration of the  Covid-19 declaration under Section 564(b)(1) of the Act, 21  U.S.C. section 360bbb-3(b)(1), unless the authorization is  terminated or revoked. Performed at [facility], [address] [place], [place] [address]   Blood Culture (routine x 2)     Status: None (Preliminary result)   Collection Time: Sep 11, 2020  6:42 AM   Specimen: BLOOD  Result Value Ref Range Status   Specimen Description BLOOD LEFT ANTECUBITAL  Final   Special Requests   Final    BOTTLES DRAWN AEROBIC AND ANAEROBIC Blood Culture adequate volume   Culture   Final    NO GROWTH < 12 HOURS Performed at [facility], [address], [place], [place] [address]    Report Status PENDING  Incomplete    Lab Basic Metabolic Panel: Recent Labs  Lab 09/11/20 0936  NA 135  K 6.5*  CL 105  GLUCOSE 188*  BUN 76*  CREATININE 1.50*   Liver Function Tests: No results for input(s): AST, ALT, ALKPHOS, BILITOT, PROT, ALBUMIN in the last 168 hours. No results for input(s): LIPASE, AMYLASE in the last 168 hours. No results for input(s): AMMONIA in the last 168 hours. CBC: Recent Labs  Lab 2020-09-11 0930 September 11, 2020 0936  WBC 2.0*  --   NEUTROABS 1.4*  --   HGB 8.0* 5.8*  HCT 28.4* 17.0*  MCV 102.5*  --   PLT PLATELET CLUMPS NOTED ON SMEAR, UNABLE TO ESTIMATE  --    Cardiac Enzymes: No results for input(s): CKTOTAL, CKMB, CKMBINDEX, TROPONINI in the last 168 hours. Sepsis Labs: Recent Labs  Lab 2020/09/11 0915 09-11-2020 0930  WBC  --  2.0*   LATICACIDVEN >11.0*  --     Procedures/Operations  Endotracheal Intubation Right IJ CVL Placement   Martina Sinner 09/11/20, 2:47 PM

## 2020-08-30 NOTE — ED Triage Notes (Addendum)
Pt bib squad for resp distress from [facility]. Per squad patient post covid pneumonia. Patient is not speaking but will shake her head yes or no. Patient is on 15L NRB and 74%spo2. Patient has colostomy and jpeg tube. Provider, respiratory, nurse at bedside.

## 2020-08-30 NOTE — ED Notes (Signed)
Intubation: 20 mg of etomidate 200mg  of succs at [phone]

## 2020-08-30 NOTE — ED Provider Notes (Signed)
69 yo female received at signout from Dr Bebe Shaggy.  Briefly, she has a hx of rectal cancer s/p ostomy recent covid hypoxic respiratory failure (Sept-Oct 2021) c/b aspiration PNA, now s/p PEG tube palcement, presenting from [facility] with acute onset of hypoxia today.  Patient was somnolent on arrival and extremely hypoxic.  Xray showed worsening lung volumes and persistent infiltrates of the lungs.  Immediately after intubation, she went into V. Tach and received 1 round of synchronized cardioversion.    Subsequently she went into PEA arrest.  She had multiple rounds of chest compressions, NaHCO3, Calcium Gluconate given, and multiple rounds of epinephrine, with ROSC.  She subsequently lost pulses again and had another 6 minutes of CPR and another dose of epinephrine, with subsequent ROSC.  The ICU team has been paged for admission.  She has labs pending, IV fluids and IV BS antibiotics ordered per Dr. Bebe Shaggy.  Her husband and granddaughter have been notified of her critical state and advised to come to the hospital urgently.  [id number] - spoke to Docia Barrier from Critical Care, they will come see the patient  0845 - Critical care team & MD at bedside.  Patient with thready pulses, hypotensive, peripheral vasopressors running.  Care has been assumed by critical care.  The family is en route to the hospital   Terald Sleeper, MD 08-25-20 1048

## 2020-08-30 NOTE — ED Notes (Signed)
No belongings present with pt on arrival. Pt's husband and family have visited

## 2020-08-30 DEATH — deceased

## 2021-07-01 IMAGING — CT CT ABDOMEN W/O CM
2 of 4 series · 17 of 46 positions shown, 19 images · non-contrast
Comparison: Chest CT 07/27/2020

CLINICAL DATA: 69-year-old female referred for possible gastrostomy
time

EXAM:
CT ABDOMEN WITHOUT CONTRAST
TECHNIQUE: Multidetector CT imaging of the abdomen was performed following the
standard protocol without IV contrast.

[Series 3: abd/ pelvis 5.0 i30f 2 · axial · 0.78mm/px · z∈[+1228,+1483]mm · 14 of 57 slices shown, 16 images]
[im 3/57  soft-tissue]
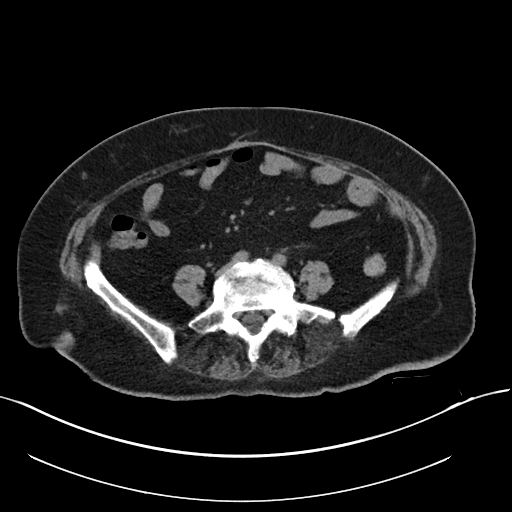
[im 3/57  bone]
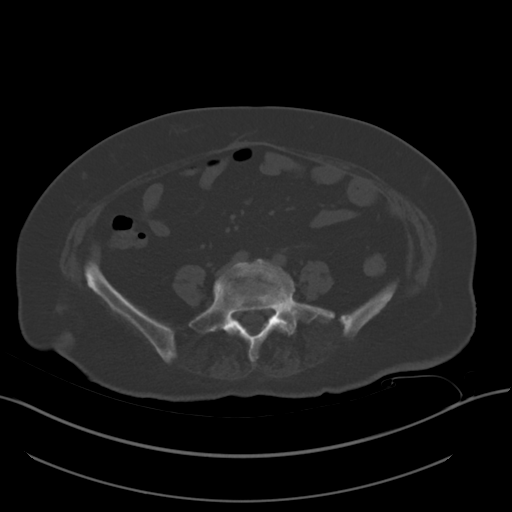
[im 8/57  soft-tissue]
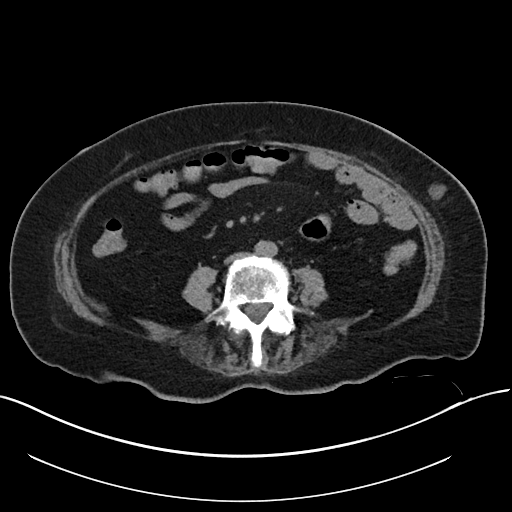
[im 12/57  soft-tissue]
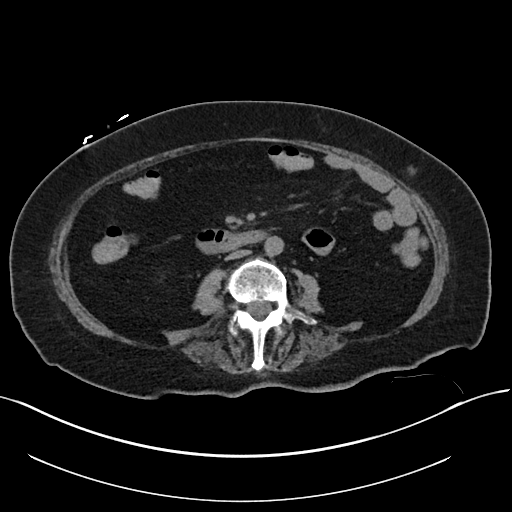
[im 15/57  soft-tissue]
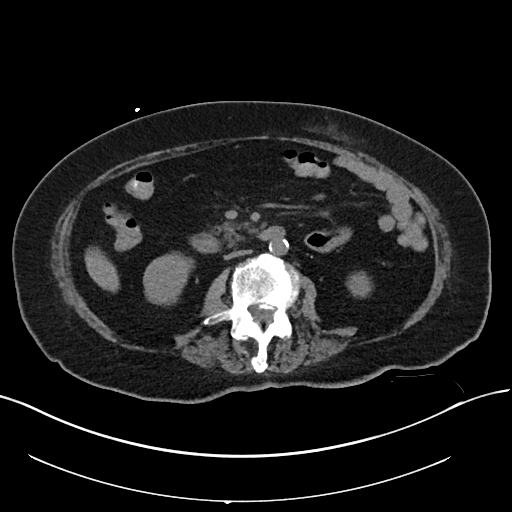
[im 19/57  soft-tissue]
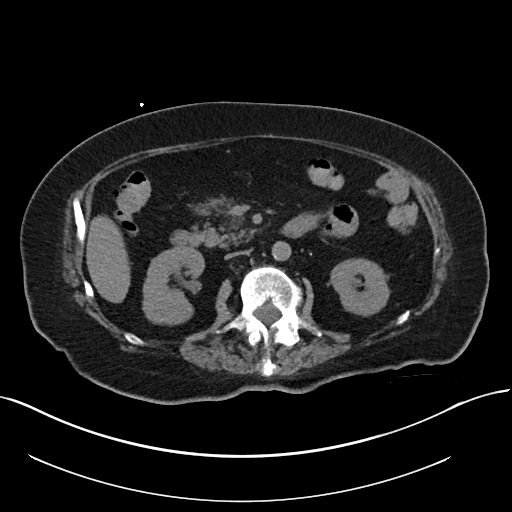
[im 24/57  soft-tissue]
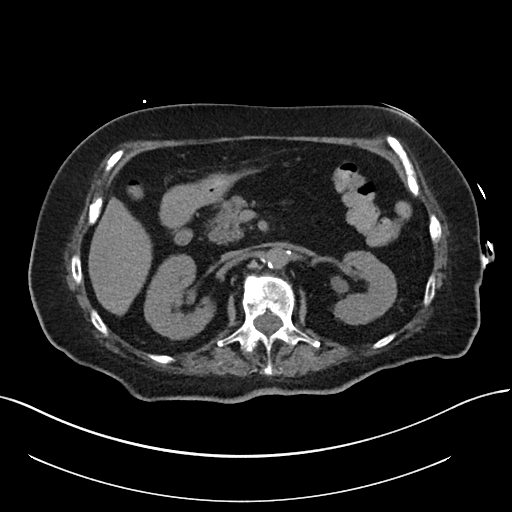
[im 26/57  soft-tissue]
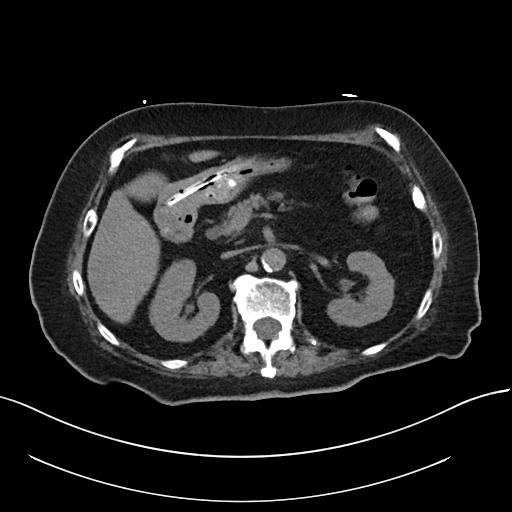
[im 31/57  soft-tissue]
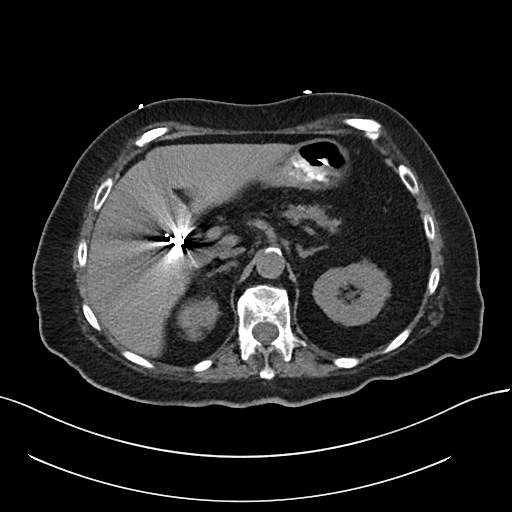
[im 33/57  soft-tissue]
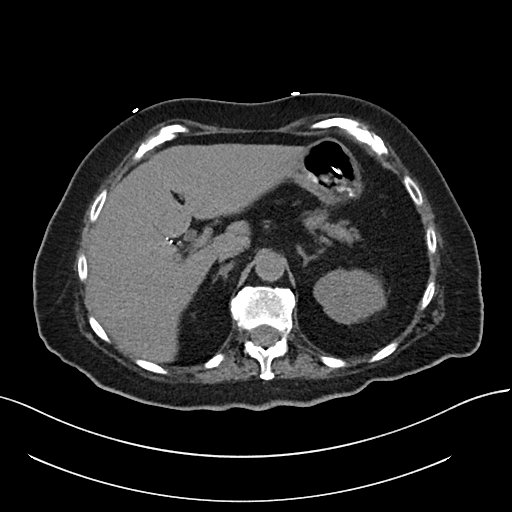
[im 33/57  bone]
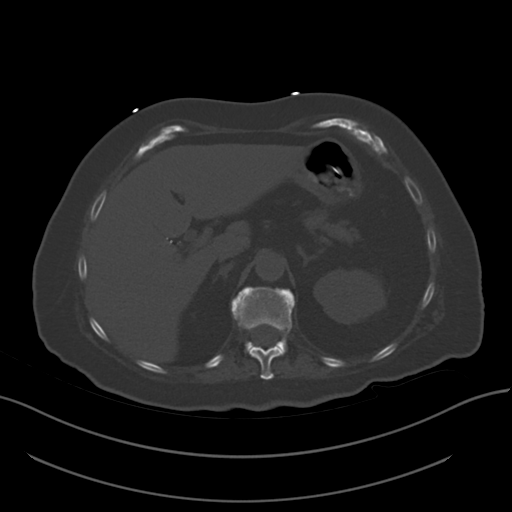
[im 38/57  soft-tissue]
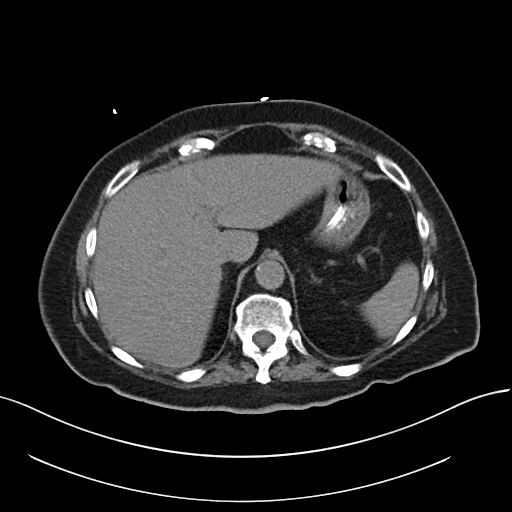
[im 43/57  soft-tissue]
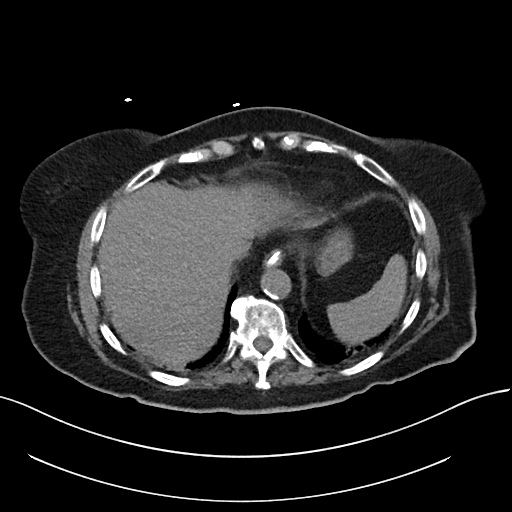
[im 45/57  soft-tissue]
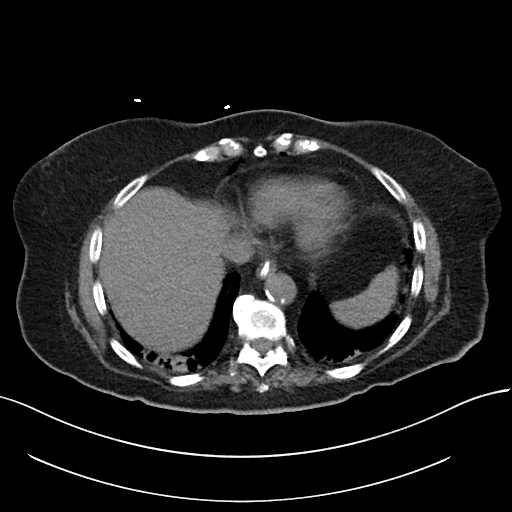
[im 50/57  soft-tissue]
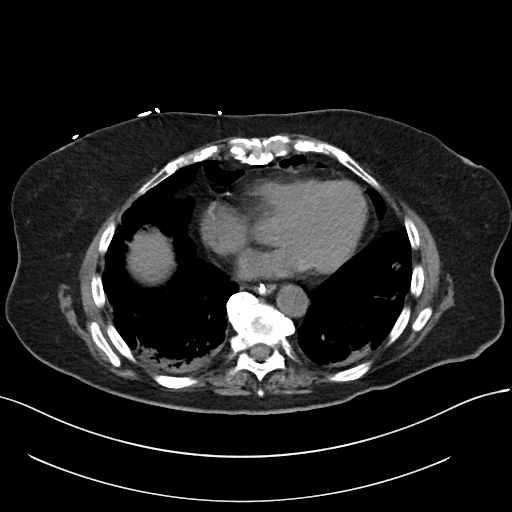
[im 54/57  soft-tissue]
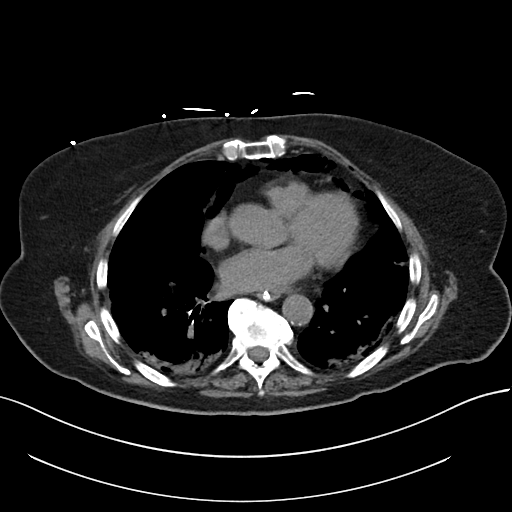

[Series 6: cor st · coronal · 0.58mm/px · 3 of 77 slices shown]
[im 26/77  soft-tissue]
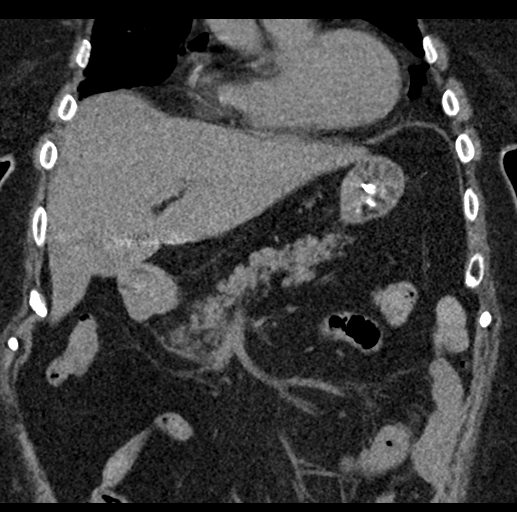
[im 34/77  soft-tissue]
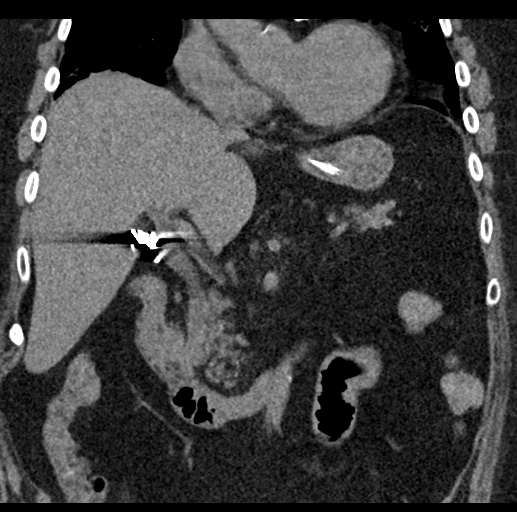
[im 43/77  soft-tissue]
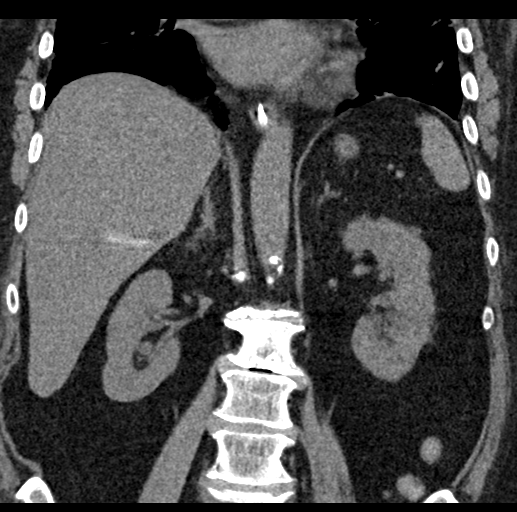

[17 of 46 positions shown; findings below may reference images not displayed]

FINDINGS: Lower chest: Persisting mediastinal gas in the lower chest, present
on the prior CT of 4271. Similar distribution of mixed
reticulonodular and ground-glass opacities of bilateral lungs. No
pneumothorax.

Hepatobiliary: Diffusely decreased liver attenuation.
Cholecystectomy.

Pancreas: Unremarkable

Spleen: Unremarkable

Adrenals/Urinary Tract: Unremarkable appearance of the adrenal
glands. Unremarkable appearance of the bilateral kidneys.

Stomach/Bowel: Gastric tube terminates within the stomach which is
decompressed. Otherwise unremarkable stomach. Visualized small bowel
and colon unremarkable.

Vascular/Lymphatic: Atherosclerotic changes the aorta. No
adenopathy.

Other: Injection granulomas of the superficial abdominal wall

Musculoskeletal: No acute displaced fracture. Degenerative changes
of the visualized thoracolumbar spine. Posterior disc osteophyte
complex at L2-L3 narrows the canal by 50%.
IMPRESSION: No acute finding in the abdomen.

Persisting mediastinal gas, which was present on the prior CT.

Similar appearance of the lungs, with changes compatible with COVID
pneumonia.

## 2021-07-05 IMAGING — XA IR PERC PLACEMENT GASTROSTOMY
1 series · 1 of 1 positions shown · non-contrast
Comparison: none

INDICATION: 69-year-old female with acute on chronic respiratory failure and
dysphagia. She presents for percutaneous gastrostomy tube placement.

[Series 1: fl (-) angio · 1 of 1 slices shown]
[im 1/1]
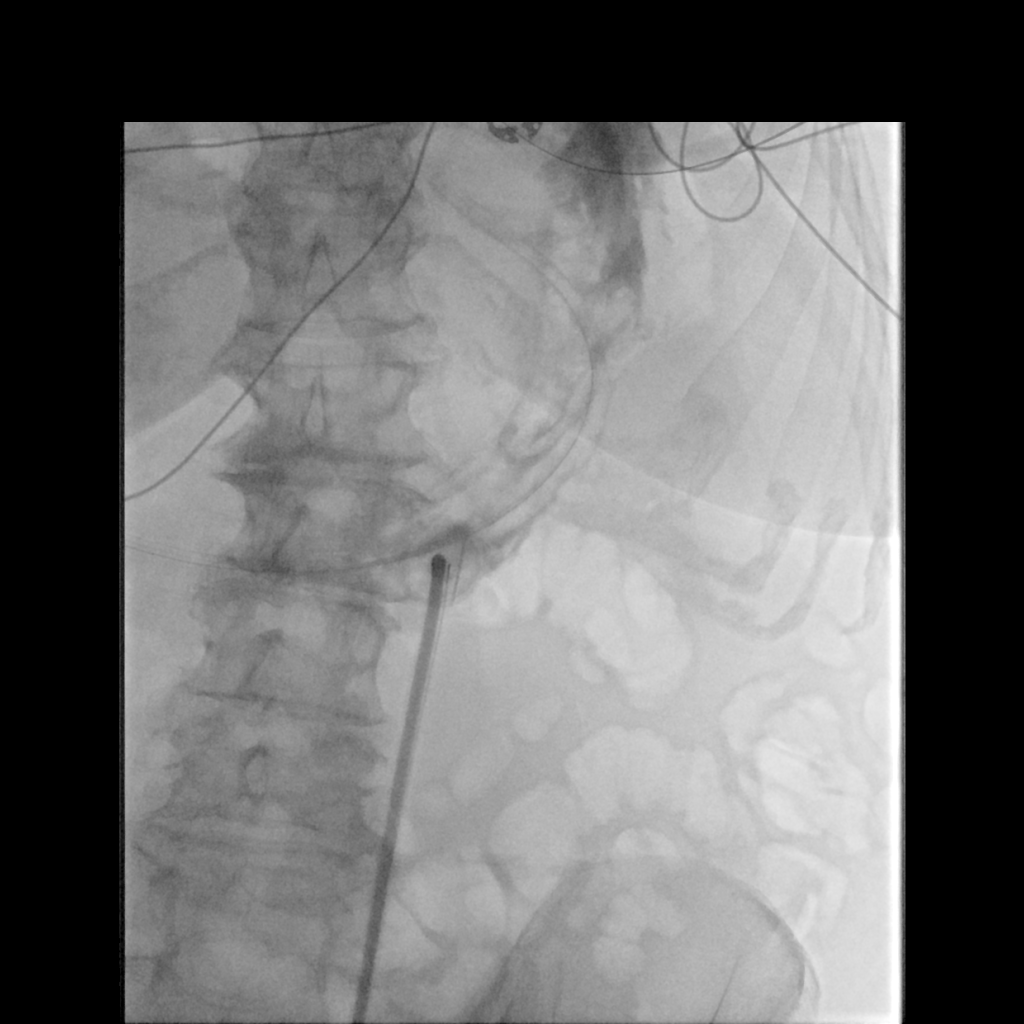

[1 of 1 positions shown; findings below may reference images not displayed]

EXAM:
Fluoroscopically guided placement of percutaneous pull-through
gastrostomy tube

MEDICATIONS:
2 g Ancef, 1 mg glucagon; Antibiotics were administered within 1
hour of the procedure.

ANESTHESIA/SEDATION:
Versed 0.5 mg IV; Fentanyl 25 mcg IV

Moderate Sedation Time:  15 minutes

The patient was continuously monitored during the procedure by the
interventional radiology nurse under my direct supervision.

CONTRAST:  15mL OMNIPAQUE IOHEXOL 300 MG/ML  SOLN

FLUOROSCOPY TIME:  Fluoroscopy Time: 5 minutes 0 seconds (20 mGy).

COMPLICATIONS:
None immediate.

PROCEDURE:
Informed written consent was obtained from the patient after a
thorough discussion of the procedural risks, benefits and
alternatives. All questions were addressed. Maximal Sterile Barrier
Technique was utilized including caps, mask, sterile gowns, sterile
gloves, sterile drape, hand hygiene and skin antiseptic. A timeout
was performed prior to the initiation of the procedure.

Maximal barrier sterile technique utilized including caps, mask,
sterile gowns, sterile gloves, large sterile drape, hand hygiene,
and chlorhexadine skin prep.

An angled catheter was advanced over a wire under fluoroscopic
guidance through the nose, down the esophagus and into the body of
the stomach. The stomach was then insufflated with several 100 ml of
air. Fluoroscopy confirmed location of the gastric bubble, as well
as inferior displacement of the barium stained colon. Under direct
fluoroscopic guidance, a single T-tack was placed, and the anterior
gastric wall drawn up against the anterior abdominal wall.
Percutaneous access was then obtained into the mid gastric body with
an 18 gauge sheath needle. Aspiration of air, and injection of
contrast material under fluoroscopy confirmed needle placement.

An Amplatz wire was advanced in the gastric body and the access
needle exchanged for a 9-French vascular sheath. A snare device was
advanced through the vascular sheath and an Amplatz wire advanced
through the angled catheter. The Amplatz wire was successfully
snared and this was pulled up through the esophagus and out the
mouth. A 20-Vrinda Rubi tube was then connected to
the snare and pulled through the mouth, down the esophagus, into the
stomach and out to the anterior abdominal wall. Hand injection of
contrast material confirmed intragastric location. The T-tack
retention suture was then cut. The pull through peg tube was then
secured with the external bumper and capped.

The patient will be observed for several hours with the newly placed
tube on low wall suction to evaluate for any post procedure
complication. The patient tolerated the procedure well, there is no
immediate complication.
IMPRESSION: Successful placement of a 20 French pull through gastrostomy tube.

## 2021-07-14 IMAGING — DX DG CHEST 1V PORT
1 series · 1 of 1 positions shown · non-contrast
Comparison: 08/17/2020 at [DATE] a.m., and earlier studies.

CLINICAL DATA: Follow-up for [id number] pneumonia. Intubated patient.
New central line.

EXAM:
PORTABLE CHEST 1 VIEW

[chest ap]
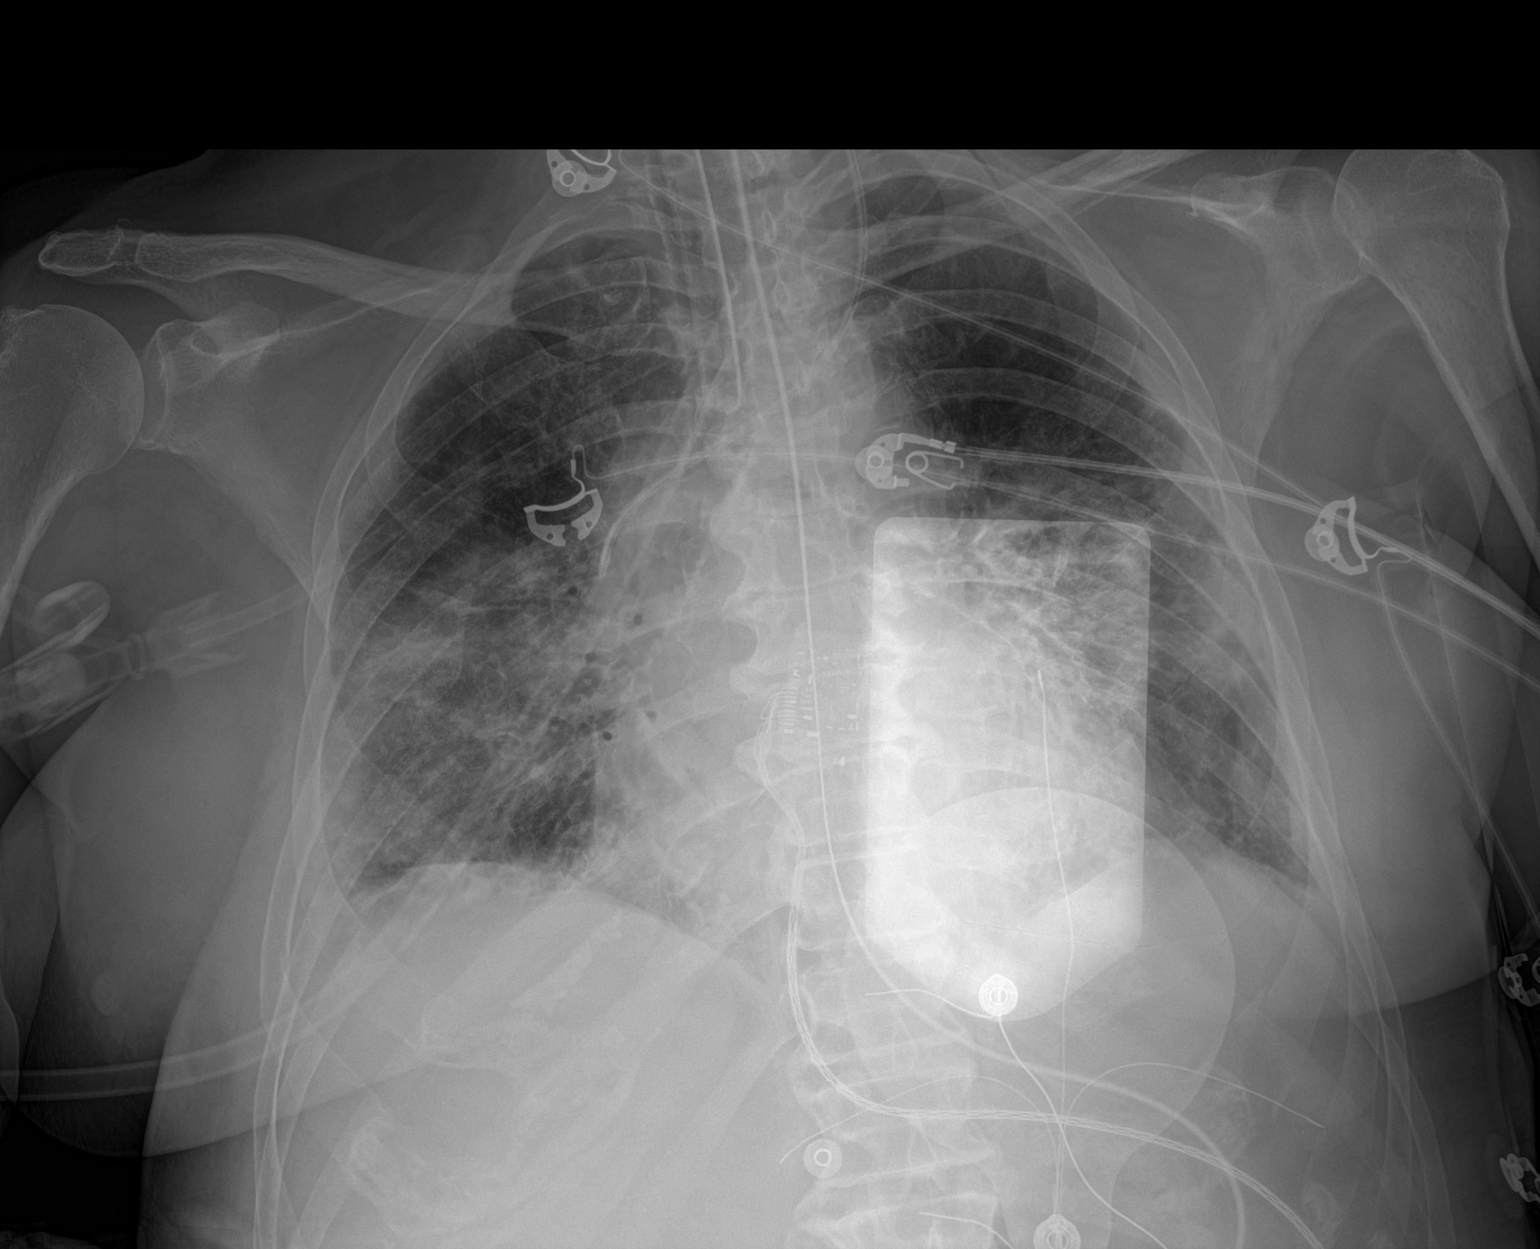

[1 of 1 positions shown; findings below may reference images not displayed]

FINDINGS: Left internal jugular central venous line has its tip projecting in
the mid superior vena cava.

No pneumothorax.

Endotracheal tube and nasal/orogastric tube are stable in well
positioned.

No change in the bilateral hazy airspace lung opacities.
IMPRESSION: 1. New left internal jugular central venous line has its tip in the
mid superior vena cava. No pneumothorax and no other change from the
earlier study.
